# Patient Record
Sex: Male | Born: 1935 | Race: White | Hispanic: No | Smoking: Former smoker
Health system: Southern US, Community
[De-identification: ages and names within clinical notes are randomized; demographics above are authoritative.]

## PROBLEM LIST (undated history)

## (undated) DIAGNOSIS — C61 Malignant neoplasm of prostate: Secondary | ICD-10-CM

## (undated) DIAGNOSIS — I739 Peripheral vascular disease, unspecified: Secondary | ICD-10-CM

## (undated) DIAGNOSIS — J189 Pneumonia, unspecified organism: Secondary | ICD-10-CM

## (undated) DIAGNOSIS — I779 Disorder of arteries and arterioles, unspecified: Secondary | ICD-10-CM

## (undated) DIAGNOSIS — M353 Polymyalgia rheumatica: Secondary | ICD-10-CM

## (undated) DIAGNOSIS — I10 Essential (primary) hypertension: Secondary | ICD-10-CM

## (undated) HISTORY — PX: COLONOSCOPY: SHX174

## (undated) HISTORY — PX: SHOULDER SURGERY: SHX246

## (undated) HISTORY — PX: PROSTATE SURGERY: SHX751

## (undated) HISTORY — PX: TONSILLECTOMY: SUR1361

## (undated) HISTORY — PX: OTHER SURGICAL HISTORY: SHX169

---

## 1974-06-30 HISTORY — PX: VASECTOMY: SHX75

## 2001-02-02 ENCOUNTER — Ambulatory Visit (HOSPITAL_COMMUNITY): Admission: RE | Admit: 2001-02-02 | Discharge: 2001-02-02 | Payer: Self-pay | Admitting: Orthopaedic Surgery

## 2002-11-11 ENCOUNTER — Encounter (INDEPENDENT_AMBULATORY_CARE_PROVIDER_SITE_OTHER): Payer: Self-pay | Admitting: Specialist

## 2002-11-11 ENCOUNTER — Ambulatory Visit (HOSPITAL_COMMUNITY): Admission: RE | Admit: 2002-11-11 | Discharge: 2002-11-11 | Payer: Self-pay | Admitting: Gastroenterology

## 2003-04-06 ENCOUNTER — Encounter: Payer: Self-pay | Admitting: Internal Medicine

## 2003-04-06 ENCOUNTER — Encounter: Admission: RE | Admit: 2003-04-06 | Discharge: 2003-04-06 | Payer: Self-pay | Admitting: Internal Medicine

## 2006-12-01 ENCOUNTER — Ambulatory Visit: Admission: RE | Admit: 2006-12-01 | Discharge: 2007-03-10 | Payer: Self-pay | Admitting: Radiation Oncology

## 2006-12-09 ENCOUNTER — Encounter: Admission: RE | Admit: 2006-12-09 | Discharge: 2006-12-09 | Payer: Self-pay | Admitting: Urology

## 2007-01-25 ENCOUNTER — Ambulatory Visit (HOSPITAL_BASED_OUTPATIENT_CLINIC_OR_DEPARTMENT_OTHER): Admission: RE | Admit: 2007-01-25 | Discharge: 2007-01-25 | Payer: Self-pay | Admitting: Urology

## 2007-07-14 ENCOUNTER — Ambulatory Visit: Payer: Self-pay | Admitting: Vascular Surgery

## 2007-07-14 ENCOUNTER — Encounter (INDEPENDENT_AMBULATORY_CARE_PROVIDER_SITE_OTHER): Payer: Self-pay | Admitting: Internal Medicine

## 2007-07-14 ENCOUNTER — Ambulatory Visit (HOSPITAL_COMMUNITY): Admission: RE | Admit: 2007-07-14 | Discharge: 2007-07-14 | Payer: Self-pay | Admitting: Internal Medicine

## 2009-06-09 ENCOUNTER — Encounter: Admission: RE | Admit: 2009-06-09 | Discharge: 2009-06-09 | Payer: Self-pay | Admitting: Orthopaedic Surgery

## 2009-10-10 ENCOUNTER — Ambulatory Visit: Payer: Self-pay | Admitting: Surgery

## 2009-10-10 ENCOUNTER — Ambulatory Visit (HOSPITAL_COMMUNITY): Admission: RE | Admit: 2009-10-10 | Discharge: 2009-10-10 | Payer: Self-pay | Admitting: Internal Medicine

## 2009-10-10 ENCOUNTER — Encounter (INDEPENDENT_AMBULATORY_CARE_PROVIDER_SITE_OTHER): Payer: Self-pay | Admitting: Internal Medicine

## 2010-11-12 NOTE — Op Note (Signed)
Jerry Booth, Jerry Booth              ACCOUNT NO.:  [id number]   MEDICAL RECORD NO.:  [id number]          PATIENT TYPE:  AMB   LOCATION:  [facility]                         FACILITY:  [facility]   PHYSICIAN:  Maretta Bees. Vonita Moss, M.D.DATE OF BIRTH:  [birth year]   DATE OF PROCEDURE:  01/25/2007  DATE OF DISCHARGE:                               OPERATIVE REPORT   PREOPERATIVE DIAGNOSIS:  Carcinoma of the prostate.   POSTOPERATIVE DIAGNOSIS:  Carcinoma of the prostate.   PROCEDURE:  Radioactive seed implantation to prostate and cystoscopy.   SURGEON:  Dr. Larey Dresser.   ASSISTANT:  Dr. Chipper Herb.   ANESTHESIA:  General.   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  He was  counseled about therapeutic measures and opted for radioactive seed  implantation.  He is brought to the OR for that reason.   PROCEDURE:  The patient was brought to the operating room, placed in  lithotomy position, external genitalia and lower abdomen were prepped  and draped in the usual fashion.  A Foley catheter was inserted and a  rectal catheter was inserted and transrectal ultrasound treatment  planning was performed. He then underwent radioactive seed implantation  by utilizing 30 needles and 76 total seeds composed of I-125, total  activity was 33.5160 mCi. The needle and seed placement was felt to be  excellent.  Postop radiograph was indicative of a good implant. After  removal of the stabilizing needles and Foley catheter,  he was  cystoscoped and the anterior and prostatic urethra and bladder were all  were unremarkable with no evidence of seed or injury. The Foley catheter  was reinserted and he was taken to the recovery room in good condition  having tolerated the procedure well.      Maretta Bees. Vonita Moss, M.D.  Electronically Signed     LJP/MEDQ  D:  01/25/2007  T:  01/25/2007  Job:  [id number]

## 2010-11-15 NOTE — Op Note (Signed)
   NAME:  Jerry Booth, Jerry Booth                        ACCOUNT NO.:  [id number]   MEDICAL RECORD NO.:  [id number]                   PATIENT TYPE:  AMB   LOCATION:  ENDO                                 FACILITY:  [facility]   PHYSICIAN:  John C. Madilyn Fireman, M.D.                 DATE OF BIRTH:  [birth year]   DATE OF PROCEDURE:  11/11/2002  DATE OF DISCHARGE:                                 OPERATIVE REPORT   PROCEDURE:  Colonoscopy with polypectomy.   INDICATIONS FOR PROCEDURE:  Colon cancer screening in a 77 year old patient  with no prior screening.   DESCRIPTION OF PROCEDURE:  The patient was placed in the left lateral  decubitus position then placed on the pulse monitor with continuous low flow  oxygen delivered by nasal cannula. He was sedated with 62.5 mcg IV fentanyl  and 5 mg IV Versed. The Olympus video colonoscope was inserted into the  rectum and advanced to the cecum, confirmed by transillumination at  McBurney's point and visualization of the ileocecal valve and appendiceal  orifice. The prep was excellent. The cecum appeared normal with no masses,  polyps, diverticula or other mucosal abnormalities. There were scattered  diverticula seen in the ascending, transverse, descending and sigmoid colon  with no other abnormalities seen. The rectum appeared normal and retroflexed  view of the anus revealed only small internal hemorrhoids. The scope was  then withdrawn and the patient returned to the recovery room in stable  condition. The patient tolerated the procedure well and there were no  immediate complications.   IMPRESSION:  6 mm rectal polyp, otherwise, normal study.   PLAN:  Await biopsy results to determine method and interval for future  colon screening.                                               John C. Madilyn Fireman, M.D.    JCH/MEDQ  D:  11/11/2002  T:  11/11/2002  Job:  [id number]   cc:   Armstead Peaks, M.D.  [address] [place], [place] [address]  Fax: [phone]

## 2011-01-20 ENCOUNTER — Other Ambulatory Visit: Payer: Self-pay | Admitting: Dermatology

## 2011-04-10 ENCOUNTER — Other Ambulatory Visit: Payer: Self-pay | Admitting: Dermatology

## 2011-04-14 LAB — CBC
HCT: 43.8
Hemoglobin: 15.2
MCHC: 34.7
MCV: 97.1
Platelets: 257
RDW: 13.9

## 2011-04-14 LAB — COMPREHENSIVE METABOLIC PANEL
Albumin: 4.2
Alkaline Phosphatase: 67
BUN: 18
Calcium: 9.2
Creatinine, Ser: 0.92
Glucose, Bld: 94
Total Protein: 6.6

## 2011-04-14 LAB — PROTIME-INR
INR: 0.9
Prothrombin Time: 12.7

## 2011-04-14 LAB — APTT: aPTT: 31

## 2011-04-28 ENCOUNTER — Emergency Department (HOSPITAL_COMMUNITY)
Admission: EM | Admit: 2011-04-28 | Discharge: 2011-04-28 | Disposition: A | Discharge status: Home / Self Care | Payer: Medicare Other | Attending: Emergency Medicine | Admitting: Emergency Medicine

## 2011-04-28 DIAGNOSIS — R339 Retention of urine, unspecified: Secondary | ICD-10-CM | POA: Insufficient documentation

## 2011-04-28 DIAGNOSIS — Z9889 Other specified postprocedural states: Secondary | ICD-10-CM | POA: Insufficient documentation

## 2011-04-28 DIAGNOSIS — I10 Essential (primary) hypertension: Secondary | ICD-10-CM | POA: Insufficient documentation

## 2011-04-28 DIAGNOSIS — Z8546 Personal history of malignant neoplasm of prostate: Secondary | ICD-10-CM | POA: Insufficient documentation

## 2011-04-28 LAB — URINALYSIS, ROUTINE W REFLEX MICROSCOPIC
Bilirubin Urine: NEGATIVE
Glucose, UA: NEGATIVE mg/dL
Ketones, ur: NEGATIVE mg/dL
Leukocytes, UA: NEGATIVE
Nitrite: NEGATIVE
Protein, ur: NEGATIVE mg/dL
Specific Gravity, Urine: 1.006 (ref 1.005–1.030)
Urobilinogen, UA: 0.2 mg/dL (ref 0.0–1.0)
pH: 6.5 (ref 5.0–8.0)

## 2011-04-28 LAB — URINE MICROSCOPIC-ADD ON

## 2011-04-30 LAB — URINE CULTURE
Colony Count: NO GROWTH
Culture  Setup Time: 201210300359
Culture: NO GROWTH

## 2011-11-14 ENCOUNTER — Other Ambulatory Visit: Payer: Self-pay | Admitting: Dermatology

## 2012-10-19 ENCOUNTER — Other Ambulatory Visit: Payer: Self-pay | Admitting: Dermatology

## 2013-02-10 ENCOUNTER — Other Ambulatory Visit: Payer: Self-pay

## 2013-04-12 ENCOUNTER — Other Ambulatory Visit: Payer: Self-pay | Admitting: Dermatology

## 2013-09-01 ENCOUNTER — Other Ambulatory Visit: Payer: Self-pay | Admitting: Dermatology

## 2013-10-07 HISTORY — PX: OTHER SURGICAL HISTORY: SHX169

## 2013-11-16 ENCOUNTER — Other Ambulatory Visit (HOSPITAL_COMMUNITY): Payer: Self-pay | Admitting: Internal Medicine

## 2013-11-16 DIAGNOSIS — I6529 Occlusion and stenosis of unspecified carotid artery: Secondary | ICD-10-CM

## 2013-11-23 ENCOUNTER — Ambulatory Visit (HOSPITAL_COMMUNITY)
Admission: RE | Admit: 2013-11-23 | Discharge: 2013-11-23 | Disposition: A | Discharge status: Home / Self Care | Payer: Medicare HMO | Source: Ambulatory Visit | Attending: Internal Medicine | Admitting: Internal Medicine

## 2013-11-23 DIAGNOSIS — I6529 Occlusion and stenosis of unspecified carotid artery: Secondary | ICD-10-CM | POA: Insufficient documentation

## 2013-11-23 NOTE — Progress Notes (Signed)
Bilateral carotid artery duplex completed.  Right:  1-39% ICA stenosis.  Left:  40-59% internal carotid artery stenosis.  Bilateral:  Vertebral artery flow is antegrade.

## 2013-12-08 ENCOUNTER — Other Ambulatory Visit: Payer: Self-pay | Admitting: Dermatology

## 2014-01-10 ENCOUNTER — Other Ambulatory Visit: Payer: Self-pay | Admitting: Dermatology

## 2014-03-13 ENCOUNTER — Other Ambulatory Visit: Payer: Self-pay | Admitting: Dermatology

## 2014-03-20 ENCOUNTER — Other Ambulatory Visit: Payer: Self-pay | Admitting: Dermatology

## 2014-10-19 ENCOUNTER — Other Ambulatory Visit: Payer: Self-pay | Admitting: Dermatology

## 2015-04-03 DIAGNOSIS — Z23 Encounter for immunization: Secondary | ICD-10-CM | POA: Diagnosis not present

## 2015-06-07 DIAGNOSIS — I10 Essential (primary) hypertension: Secondary | ICD-10-CM | POA: Diagnosis not present

## 2015-07-23 DIAGNOSIS — Z85828 Personal history of other malignant neoplasm of skin: Secondary | ICD-10-CM | POA: Diagnosis not present

## 2015-07-23 DIAGNOSIS — C44321 Squamous cell carcinoma of skin of nose: Secondary | ICD-10-CM | POA: Diagnosis not present

## 2015-07-23 DIAGNOSIS — L570 Actinic keratosis: Secondary | ICD-10-CM | POA: Diagnosis not present

## 2015-07-23 DIAGNOSIS — D485 Neoplasm of uncertain behavior of skin: Secondary | ICD-10-CM | POA: Diagnosis not present

## 2015-07-30 DIAGNOSIS — C44321 Squamous cell carcinoma of skin of nose: Secondary | ICD-10-CM | POA: Diagnosis not present

## 2015-07-30 DIAGNOSIS — L570 Actinic keratosis: Secondary | ICD-10-CM | POA: Diagnosis not present

## 2015-07-30 DIAGNOSIS — D485 Neoplasm of uncertain behavior of skin: Secondary | ICD-10-CM | POA: Diagnosis not present

## 2015-07-30 DIAGNOSIS — Z85828 Personal history of other malignant neoplasm of skin: Secondary | ICD-10-CM | POA: Diagnosis not present

## 2015-08-09 DIAGNOSIS — C44321 Squamous cell carcinoma of skin of nose: Secondary | ICD-10-CM | POA: Diagnosis not present

## 2015-08-09 DIAGNOSIS — Z85828 Personal history of other malignant neoplasm of skin: Secondary | ICD-10-CM | POA: Diagnosis not present

## 2015-08-22 DIAGNOSIS — R69 Illness, unspecified: Secondary | ICD-10-CM | POA: Diagnosis not present

## 2015-09-20 DIAGNOSIS — D485 Neoplasm of uncertain behavior of skin: Secondary | ICD-10-CM | POA: Diagnosis not present

## 2015-09-20 DIAGNOSIS — L821 Other seborrheic keratosis: Secondary | ICD-10-CM | POA: Diagnosis not present

## 2015-09-20 DIAGNOSIS — D1801 Hemangioma of skin and subcutaneous tissue: Secondary | ICD-10-CM | POA: Diagnosis not present

## 2015-09-20 DIAGNOSIS — L570 Actinic keratosis: Secondary | ICD-10-CM | POA: Diagnosis not present

## 2015-09-20 DIAGNOSIS — Z85828 Personal history of other malignant neoplasm of skin: Secondary | ICD-10-CM | POA: Diagnosis not present

## 2015-09-20 DIAGNOSIS — D045 Carcinoma in situ of skin of trunk: Secondary | ICD-10-CM | POA: Diagnosis not present

## 2015-09-20 DIAGNOSIS — L853 Xerosis cutis: Secondary | ICD-10-CM | POA: Diagnosis not present

## 2015-09-24 DIAGNOSIS — H5213 Myopia, bilateral: Secondary | ICD-10-CM | POA: Diagnosis not present

## 2015-11-22 DIAGNOSIS — J069 Acute upper respiratory infection, unspecified: Secondary | ICD-10-CM | POA: Diagnosis not present

## 2015-12-06 DIAGNOSIS — Z1389 Encounter for screening for other disorder: Secondary | ICD-10-CM | POA: Diagnosis not present

## 2015-12-06 DIAGNOSIS — E7800 Pure hypercholesterolemia, unspecified: Secondary | ICD-10-CM | POA: Diagnosis not present

## 2015-12-06 DIAGNOSIS — R7301 Impaired fasting glucose: Secondary | ICD-10-CM | POA: Diagnosis not present

## 2015-12-06 DIAGNOSIS — I10 Essential (primary) hypertension: Secondary | ICD-10-CM | POA: Diagnosis not present

## 2015-12-06 DIAGNOSIS — Z0000 Encounter for general adult medical examination without abnormal findings: Secondary | ICD-10-CM | POA: Diagnosis not present

## 2016-01-04 DIAGNOSIS — Z85828 Personal history of other malignant neoplasm of skin: Secondary | ICD-10-CM | POA: Diagnosis not present

## 2016-01-04 DIAGNOSIS — L570 Actinic keratosis: Secondary | ICD-10-CM | POA: Diagnosis not present

## 2016-01-04 DIAGNOSIS — L821 Other seborrheic keratosis: Secondary | ICD-10-CM | POA: Diagnosis not present

## 2016-01-04 DIAGNOSIS — L812 Freckles: Secondary | ICD-10-CM | POA: Diagnosis not present

## 2016-01-04 DIAGNOSIS — D692 Other nonthrombocytopenic purpura: Secondary | ICD-10-CM | POA: Diagnosis not present

## 2016-01-28 DIAGNOSIS — E7800 Pure hypercholesterolemia, unspecified: Secondary | ICD-10-CM | POA: Diagnosis not present

## 2016-01-28 DIAGNOSIS — Z0000 Encounter for general adult medical examination without abnormal findings: Secondary | ICD-10-CM | POA: Diagnosis not present

## 2016-01-28 DIAGNOSIS — I10 Essential (primary) hypertension: Secondary | ICD-10-CM | POA: Diagnosis not present

## 2016-03-31 DIAGNOSIS — R69 Illness, unspecified: Secondary | ICD-10-CM | POA: Diagnosis not present

## 2016-04-09 DIAGNOSIS — Z8546 Personal history of malignant neoplasm of prostate: Secondary | ICD-10-CM | POA: Diagnosis not present

## 2016-04-09 DIAGNOSIS — K4090 Unilateral inguinal hernia, without obstruction or gangrene, not specified as recurrent: Secondary | ICD-10-CM | POA: Diagnosis not present

## 2016-05-06 DIAGNOSIS — L820 Inflamed seborrheic keratosis: Secondary | ICD-10-CM | POA: Diagnosis not present

## 2016-05-06 DIAGNOSIS — D485 Neoplasm of uncertain behavior of skin: Secondary | ICD-10-CM | POA: Diagnosis not present

## 2016-05-06 DIAGNOSIS — C44519 Basal cell carcinoma of skin of other part of trunk: Secondary | ICD-10-CM | POA: Diagnosis not present

## 2016-05-06 DIAGNOSIS — Z85828 Personal history of other malignant neoplasm of skin: Secondary | ICD-10-CM | POA: Diagnosis not present

## 2016-05-06 DIAGNOSIS — L821 Other seborrheic keratosis: Secondary | ICD-10-CM | POA: Diagnosis not present

## 2016-05-06 DIAGNOSIS — L812 Freckles: Secondary | ICD-10-CM | POA: Diagnosis not present

## 2016-05-06 DIAGNOSIS — L570 Actinic keratosis: Secondary | ICD-10-CM | POA: Diagnosis not present

## 2016-06-06 DIAGNOSIS — I10 Essential (primary) hypertension: Secondary | ICD-10-CM | POA: Diagnosis not present

## 2016-06-06 DIAGNOSIS — R7301 Impaired fasting glucose: Secondary | ICD-10-CM | POA: Diagnosis not present

## 2016-06-06 DIAGNOSIS — K4090 Unilateral inguinal hernia, without obstruction or gangrene, not specified as recurrent: Secondary | ICD-10-CM | POA: Diagnosis not present

## 2016-06-27 ENCOUNTER — Ambulatory Visit: Payer: Self-pay | Admitting: General Surgery

## 2016-06-27 DIAGNOSIS — K4090 Unilateral inguinal hernia, without obstruction or gangrene, not specified as recurrent: Secondary | ICD-10-CM | POA: Diagnosis not present

## 2016-06-27 NOTE — H&P (Signed)
HANIF YANNUZZI 06/27/2016 10:49 AM Location: [facility] Patient #: [id number] DOB: 1931/05/17 Married / Language: [name] / Race: White Male   History of Present Illness [name] MD; 06/27/2016 11:30 AM) The patient is a 82 year old male.  Note:He is referred by Dr. Lavone Orn because of a symptomatic right inguinal hernia. The hernias been long-standing but recently it become uncomfortable at times and larger. He denies any difficulty with urination or constipation. He is active and exercises at the [facility]. He also plays golf. He is unaware of any family history of inguinal hernia.  His past medical history is notable for some hypertension and also a 50% left internal carotid artery stenosis that is being monitored.  Past Surgical History Nance Pear, [place]; 06/27/2016 10:49 AM) Cataract Surgery  Bilateral. Shoulder Surgery  Bilateral. Tonsillectomy  TURP  Vasectomy   Diagnostic Studies History Nance Pear, [place]; 06/27/2016 10:49 AM) Colonoscopy  5-10 years ago  Allergies Nance Pear, CMA; 06/27/2016 10:49 AM) No Known Drug Allergies 06/27/2016  Medication History Nance Pear, CMA; 06/27/2016 10:51 AM) Felodipine (5MG  Tablet ER, Oral daily) Active. Atorvastatin Calcium (40MG  Tablet, Oral daily) Active. Aspirin (81MG  Tablet, Oral daily) Active. Medications Reconciled  Social History Nance Pear, [place]; 06/27/2016 10:49 AM) Alcohol use  Moderate alcohol use. Caffeine use  Carbonated beverages, Coffee, Tea. No drug use  Tobacco use  Former smoker.  Family History Nance Pear, [place]; 06/27/2016 10:49 AM) Cancer  Brother. Cerebrovascular Accident  Father. Hypertension  Sister.  Other Problems Nance Pear, CMA; 06/27/2016 10:49 AM) High blood pressure  Inguinal Hernia  Prostate Cancer     Review of Systems Nance Pear CMA; 06/27/2016 10:49 AM) General Not Present- Appetite Loss, Chills, Fatigue, Fever,  Night Sweats, Weight Gain and Weight Loss. Skin Not Present- Change in Wart/Mole, Dryness, Hives, Jaundice, New Lesions, Non-Healing Wounds, Rash and Ulcer. HEENT Not Present- Earache, Hearing Loss, Hoarseness, Nose Bleed, Oral Ulcers, Ringing in the Ears, Seasonal Allergies, Sinus Pain, Sore Throat, Visual Disturbances, Wears glasses/contact lenses and Yellow Eyes. Respiratory Not Present- Bloody sputum, Chronic Cough, Difficulty Breathing, Snoring and Wheezing. Breast Not Present- Breast Mass, Breast Pain, Nipple Discharge and Skin Changes. Cardiovascular Not Present- Chest Pain, Difficulty Breathing Lying Down, Leg Cramps, Palpitations, Rapid Heart Rate, Shortness of Breath and Swelling of Extremities. Gastrointestinal Not Present- Abdominal Pain, Bloating, Bloody Stool, Change in Bowel Habits, Chronic diarrhea, Constipation, Difficulty Swallowing, Excessive gas, Gets full quickly at meals, Hemorrhoids, Indigestion, Nausea, Rectal Pain and Vomiting. Male Genitourinary Not Present- Blood in Urine, Change in Urinary Stream, Frequency, Impotence, Nocturia, Painful Urination, Urgency and Urine Leakage. Musculoskeletal Not Present- Back Pain, Joint Pain, Joint Stiffness, Muscle Pain, Muscle Weakness and Swelling of Extremities. Neurological Not Present- Decreased Memory, Fainting, Headaches, Numbness, Seizures, Tingling, Tremor, Trouble walking and Weakness. Psychiatric Not Present- Anxiety, Bipolar, Change in Sleep Pattern, Depression, Fearful and Frequent crying. Endocrine Not Present- Cold Intolerance, Excessive Hunger, Hair Changes, Heat Intolerance, Hot flashes and New Diabetes. Hematology Not Present- Blood Thinners, Easy Bruising, Excessive bleeding, Gland problems, HIV and Persistent Infections.  Vitals Bary Castilla Bradford CMA; 06/27/2016 10:51 AM) 06/27/2016 10:51 AM Weight: 169.6 lb Height: 70in Body Surface Area: 1.95 m Body Mass Index: 24.33 kg/m  Temp.: 98.42F  Pulse: 61 (Regular)   BP: 130/78 (Sitting, Left Arm, Standard)    Physical Exam [name] MD; 06/27/2016 11:35 AM) The physical exam findings are as follows: Note:General: WDWN in NAD. Pleasant and cooperative.  HEENT: [place]/AT, no external nasal or ear masses,  mucous membranes are moist  EYES: EOMI, slight redness in sclera of left eye  CV: RRR, no murmur, no edema  CHEST/RESP: Breath sounds equal and clear. Respirations nonlabored.  ABDOMEN: Soft, nontender, nondistended, no umbilical hernia  GU: Reducible right inguinal bulge, no left inguinal bulge. No testicular masses  SKIN: No jaundice.  NEUROLOGIC: Alert and oriented, answers questions appropriately, normal gait and station.  PSYCHIATRIC: Normal mood, affect , and behavior.    Assessment & Plan [name] MD; 06/27/2016 11:33 AM) INGUINAL HERNIA OF RIGHT SIDE WITHOUT OBSTRUCTION OR GANGRENE (K40.90) Impression: This is become symptomatic recently and larger. I recommended open repair with mesh and he is agreeable with this.  Plan: Open right inguinal hernia with mesh as an outpatient. I've asked him to stop his aspirin 5 days prior to surgery. I have explained the procedure, risks, and aftercare of inguinal hernia repair. Risks include but are not limited to bleeding, infection, wound problems, anesthesia, recurrence, bladder or intestine injury, urinary retention, testicular dysfunction, chronic pain, mesh problems. He seems to understand.  Jackolyn Confer, M.D.

## 2016-07-10 NOTE — Pre-Procedure Instructions (Signed)
Jerry Booth  07/10/2016      [id number] [place], [place] [id number] [place] [id number] Phone: [phone] Fax: [phone]    Your procedure is scheduled on January 16  Report to [facility] at [place].M.  Call this number if you have problems the morning of surgery:  [phone]   Remember:  Do not eat food or drink liquids after midnight.   Take these medicines the morning of surgery with A SIP OF WATER Tylenol if needed,   7 days prior to surgery STOP taking any Aspirin, Aleve, Naproxen, Ibuprofen, Motrin, Advil, Goody's, BC's, all herbal medications, fish oil, and all vitamins    Do not wear jewelry.  Do not wear lotions, powders, or cologne, or deoderant.  Men may shave face and neck.  Do not bring valuables to the hospital.  [facility] is not responsible for any belongings or valuables.  Contacts, dentures or bridgework may not be worn into surgery.  Leave your suitcase in the car.  After surgery it may be brought to your room.  For patients admitted to the hospital, discharge time will be determined by your treatment team.  Patients discharged the day of surgery will not be allowed to drive home.    Special instructions:   [facility]- Preparing For Surgery  Before surgery, you can play an important role. Because skin is not sterile, your skin needs to be as free of germs as possible. You can reduce the number of germs on your skin by washing with CHG (chlorahexidine gluconate) Soap before surgery.  CHG is an antiseptic cleaner which kills germs and bonds with the skin to continue killing germs even after washing.  Please do not use if you have an allergy to CHG or antibacterial soaps. If your skin becomes reddened/irritated stop using the CHG.  Do not shave (including legs and underarms) for at least 48 hours prior to first CHG shower. It is OK to shave  your face.  Please follow these instructions carefully.   1. Shower the NIGHT BEFORE SURGERY and the MORNING OF SURGERY with CHG.   2. If you chose to wash your hair, wash your hair first as usual with your normal shampoo.  3. After you shampoo, rinse your hair and body thoroughly to remove the shampoo.  4. Use CHG as you would any other liquid soap. You can apply CHG directly to the skin and wash gently with a scrungie or a clean washcloth.   5. Apply the CHG Soap to your body ONLY FROM THE NECK DOWN.  Do not use on open wounds or open sores. Avoid contact with your eyes, ears, mouth and genitals (private parts). Wash genitals (private parts) with your normal soap.  6. Wash thoroughly, paying special attention to the area where your surgery will be performed.  7. Thoroughly rinse your body with warm water from the neck down.  8. DO NOT shower/wash with your normal soap after using and rinsing off the CHG Soap.  9. Pat yourself dry with a CLEAN TOWEL.   10. Wear CLEAN PAJAMAS   11. Place CLEAN SHEETS on your bed the night of your first shower and DO NOT SLEEP WITH PETS.    Day of Surgery: Do not apply any deodorants/lotions. Please wear clean clothes to the hospital/surgery center.      Please read over the following fact sheets  that you were given.

## 2016-07-11 ENCOUNTER — Encounter (HOSPITAL_COMMUNITY)
Admission: RE | Admit: 2016-07-11 | Discharge: 2016-07-11 | Disposition: A | Discharge status: Home / Self Care | Payer: Medicare HMO | Source: Ambulatory Visit | Attending: General Surgery | Admitting: General Surgery

## 2016-07-11 ENCOUNTER — Encounter (HOSPITAL_COMMUNITY): Payer: Self-pay

## 2016-07-11 DIAGNOSIS — I10 Essential (primary) hypertension: Secondary | ICD-10-CM | POA: Diagnosis not present

## 2016-07-11 DIAGNOSIS — E119 Type 2 diabetes mellitus without complications: Secondary | ICD-10-CM | POA: Diagnosis not present

## 2016-07-11 HISTORY — DX: Disorder of arteries and arterioles, unspecified: I77.9

## 2016-07-11 HISTORY — DX: Essential (primary) hypertension: I10

## 2016-07-11 HISTORY — DX: Peripheral vascular disease, unspecified: I73.9

## 2016-07-11 HISTORY — DX: Malignant neoplasm of prostate: C61

## 2016-07-11 LAB — COMPREHENSIVE METABOLIC PANEL
ALBUMIN: 3.8 g/dL (ref 3.5–5.0)
ALK PHOS: 56 U/L (ref 38–126)
ALT: 18 U/L (ref 17–63)
ANION GAP: 9 (ref 5–15)
AST: 21 U/L (ref 15–41)
BILIRUBIN TOTAL: 0.8 mg/dL (ref 0.3–1.2)
BUN: 18 mg/dL (ref 6–20)
CALCIUM: 9 mg/dL (ref 8.9–10.3)
CO2: 26 mmol/L (ref 22–32)
CREATININE: 1.13 mg/dL (ref 0.61–1.24)
Chloride: 105 mmol/L (ref 101–111)
GFR calc Af Amer: >60 mL/min (ref >60)
GFR calc non Af Amer: 58 mL/min — ABNORMAL LOW (ref >60)
GLUCOSE: 164 mg/dL — AB (ref 65–99)
Potassium: 4.1 mmol/L (ref 3.5–5.1)
Sodium: 140 mmol/L (ref 135–145)
TOTAL PROTEIN: 6.2 g/dL — AB (ref 6.5–8.1)

## 2016-07-11 LAB — CBC
HCT: 42.4 % (ref 39.0–52.0)
HEMOGLOBIN: 14.2 g/dL (ref 13.0–17.0)
MCH: 33.3 pg (ref 26.0–34.0)
MCHC: 33.5 g/dL (ref 30.0–36.0)
MCV: 99.5 fL (ref 78.0–100.0)
Platelets: 207 10*3/uL (ref 150–400)
RBC: 4.26 MIL/uL (ref 4.22–5.81)
RDW: 13.8 % (ref 11.5–15.5)
WBC: 4.6 10*3/uL (ref 4.0–10.5)

## 2016-07-11 NOTE — Progress Notes (Addendum)
PCP is Dr. Lavone Orn States his former PCP was a cardiologist, but he saw him for medical needs only. States he had a stress test 40 years ago. Denies chest pain. Denies ever having a stress test or card cath. EKG reading noted in care everywhere from 09-29-2013. States that the Doctor is watching his carotids, but right now nothing needs to be done. [place] noted from 11-23-2013

## 2016-07-14 ENCOUNTER — Encounter (HOSPITAL_COMMUNITY): Payer: Self-pay

## 2016-07-14 NOTE — Anesthesia Preprocedure Evaluation (Addendum)
Anesthesia Evaluation  Patient identified by MRN, date of birth, ID band Patient awake    Reviewed: Allergy & Precautions, NPO status , Patient's Chart, lab work & pertinent test results  Airway Mallampati: II  TM Distance: >3 FB Neck ROM: Full    Dental  (+) Dental Advisory Given   Pulmonary former smoker,    breath sounds clear to auscultation       Cardiovascular hypertension, Pt. on medications + Peripheral Vascular Disease   Rhythm:Regular Rate:Normal     Neuro/Psych negative neurological ROS     GI/Hepatic negative GI ROS, Neg liver ROS,   Endo/Other  negative endocrine ROS  Renal/GU negative Renal ROS     Musculoskeletal   Abdominal   Peds  Hematology negative hematology ROS (+)   Anesthesia Other Findings   Reproductive/Obstetrics                            Lab Results  Component Value Date   WBC 4.6 07/11/2016   HGB 14.2 07/11/2016   HCT 42.4 07/11/2016   MCV 99.5 07/11/2016   PLT 207 07/11/2016   Lab Results  Component Value Date   CREATININE 1.13 07/11/2016   BUN 18 07/11/2016   NA 140 07/11/2016   K 4.1 07/11/2016   CL 105 07/11/2016   CO2 26 07/11/2016    Anesthesia Physical Anesthesia Plan  ASA: II  Anesthesia Plan: General   Post-op Pain Management:  Regional for Post-op pain   Induction: Intravenous  Airway Management Planned: LMA  Additional Equipment:   Intra-op Plan:   Post-operative Plan: Extubation in OR  Informed Consent: I have reviewed the patients History and Physical, chart, labs and discussed the procedure including the risks, benefits and alternatives for the proposed anesthesia with the patient or authorized representative who has indicated his/her understanding and acceptance.   Dental advisory given  Plan Discussed with: CRNA  Anesthesia Plan Comments:        Anesthesia Quick Evaluation

## 2016-07-14 NOTE — Progress Notes (Signed)
Anesthesia Chart Review: Patient is a 83 year old male scheduled for right inguinal hernia repair with mesh on 07/15/16 by Dr. Zella Richer.  History includes HTN, carotid occlusive disease (no history of CVA), tonsillectomy, prostate cancer s/p radioactive seed '08, former smoker (quit '54).   PCP is Dr. Lavone Orn. Patient reports his doctor is following his carotid artery stenosis, but that disease is not severe enough to require intervention. (Currently, last carotid U/S I have access to is from 2015. Most recent records requested from [facility], but they are pending.)   Meds include ASA 81 mg (on hold for 5 days preoperatively), Lipitor, felodipine.  BP (!) 146/64   Pulse 76   Temp 36.6 C   Resp 20   Ht 5\' 10"  (1.778 m)   Wt 170 lb (77.1 kg)   SpO2 97%   BMI 24.39 kg/m   EKG 07/11/16: SR with occasional PVCs, Incomplete right BBB pattern in V1. PVCs are new when compared to 12/09/06 tracing.  He reports a stress test 40 years ago.   Carotid U/S 11/23/13: Summary: Right: 1-39% ICA stenosis. ICA/CCA ratio is 0.68. Left: 40-59% ICA stenosis. ICA/CCA ratio is 2.0. Bilateral: Vertebral artery flow is antegrade.  Preoperative labs noted. Non-fasting CBG 164. CBC WNL. Cr 1.13.  Above reviewed with anesthesiologist Dr. Marcie Bal. If no acute changes and he remains asymptomatic from a CV/neurologic standpoint then it is anticipated that he can proceed as planned.  George Hugh [facility] Stay [facility]/Anesthesiology Phone [phone] 07/14/2016 1:36 PM

## 2016-07-15 ENCOUNTER — Ambulatory Visit (HOSPITAL_COMMUNITY): Payer: Medicare HMO | Admitting: Vascular Surgery

## 2016-07-15 ENCOUNTER — Ambulatory Visit (HOSPITAL_COMMUNITY): Payer: Medicare HMO | Admitting: Anesthesiology

## 2016-07-15 ENCOUNTER — Ambulatory Visit (HOSPITAL_COMMUNITY)
Admission: RE | Admit: 2016-07-15 | Discharge: 2016-07-15 | Disposition: A | Discharge status: Home / Self Care | Payer: Medicare HMO | Source: Ambulatory Visit | Attending: General Surgery | Admitting: General Surgery

## 2016-07-15 ENCOUNTER — Encounter (HOSPITAL_COMMUNITY)
Admission: RE | Disposition: A | Discharge status: Home / Self Care | Payer: Self-pay | Source: Ambulatory Visit | Attending: General Surgery

## 2016-07-15 ENCOUNTER — Encounter (HOSPITAL_COMMUNITY): Payer: Self-pay | Admitting: Urology

## 2016-07-15 DIAGNOSIS — I10 Essential (primary) hypertension: Secondary | ICD-10-CM | POA: Diagnosis not present

## 2016-07-15 DIAGNOSIS — I739 Peripheral vascular disease, unspecified: Secondary | ICD-10-CM | POA: Insufficient documentation

## 2016-07-15 DIAGNOSIS — G8918 Other acute postprocedural pain: Secondary | ICD-10-CM | POA: Diagnosis not present

## 2016-07-15 DIAGNOSIS — Z87891 Personal history of nicotine dependence: Secondary | ICD-10-CM | POA: Diagnosis not present

## 2016-07-15 DIAGNOSIS — Z7982 Long term (current) use of aspirin: Secondary | ICD-10-CM | POA: Insufficient documentation

## 2016-07-15 DIAGNOSIS — Z79899 Other long term (current) drug therapy: Secondary | ICD-10-CM | POA: Diagnosis not present

## 2016-07-15 DIAGNOSIS — K4090 Unilateral inguinal hernia, without obstruction or gangrene, not specified as recurrent: Secondary | ICD-10-CM | POA: Diagnosis not present

## 2016-07-15 HISTORY — PX: INGUINAL HERNIA REPAIR: SHX194

## 2016-07-15 HISTORY — PX: INSERTION OF MESH: SHX5868

## 2016-07-15 SURGERY — REPAIR, HERNIA, INGUINAL, ADULT
Anesthesia: General | Site: Groin | Laterality: Right

## 2016-07-15 MED ORDER — PROPOFOL 10 MG/ML IV BOLUS
INTRAVENOUS | Status: DC | PRN
  Administered 2016-07-15: 160 mg via INTRAVENOUS
  Administered 2016-07-15: 40 mg via INTRAVENOUS

## 2016-07-15 MED ORDER — CHLORHEXIDINE GLUCONATE CLOTH 2 % EX PADS: 6.0000 | MEDICATED_PAD | Freq: Once | CUTANEOUS | Status: DC

## 2016-07-15 MED ORDER — ONDANSETRON HCL 4 MG/2ML IJ SOLN
INTRAMUSCULAR | Status: DC | PRN
  Administered 2016-07-15: 4 mg via INTRAVENOUS

## 2016-07-15 MED ORDER — PROMETHAZINE HCL 25 MG/ML IJ SOLN: 6.2500 mg | INTRAMUSCULAR | Status: DC | PRN

## 2016-07-15 MED ORDER — MIDAZOLAM HCL 5 MG/5ML IJ SOLN
INTRAMUSCULAR | Status: DC | PRN
  Administered 2016-07-15: 2 mg via INTRAVENOUS

## 2016-07-15 MED ORDER — 0.9 % SODIUM CHLORIDE (POUR BTL) OPTIME
TOPICAL | Status: DC | PRN
  Administered 2016-07-15: 1000 mL

## 2016-07-15 MED ORDER — DEXAMETHASONE SODIUM PHOSPHATE 10 MG/ML IJ SOLN
INTRAMUSCULAR | Status: DC | PRN
  Administered 2016-07-15: 4 mg via INTRAVENOUS

## 2016-07-15 MED ORDER — HYDROMORPHONE HCL 1 MG/ML IJ SOLN: 0.2500 mg | INTRAMUSCULAR | Status: DC | PRN

## 2016-07-15 MED ORDER — CEFAZOLIN SODIUM-DEXTROSE 2-4 GM/100ML-% IV SOLN
2.0000 g | INTRAVENOUS | Status: AC
  Administered 2016-07-15: 2 g via INTRAVENOUS
  Filled 2016-07-15: qty 100

## 2016-07-15 MED ORDER — HYDROCODONE-ACETAMINOPHEN 5-325 MG PO TABS: 1.0000 | ORAL_TABLET | ORAL | 0 refills | Status: DC | PRN

## 2016-07-15 MED ORDER — MIDAZOLAM HCL 2 MG/2ML IJ SOLN
INTRAMUSCULAR | Status: AC
  Filled 2016-07-15: qty 2

## 2016-07-15 MED ORDER — BUPIVACAINE-EPINEPHRINE (PF) 0.5% -1:200000 IJ SOLN
INTRAMUSCULAR | Status: DC | PRN
  Administered 2016-07-15: 30 mL

## 2016-07-15 MED ORDER — FENTANYL CITRATE (PF) 100 MCG/2ML IJ SOLN
INTRAMUSCULAR | Status: DC | PRN
  Administered 2016-07-15 (×2): 50 ug via INTRAVENOUS

## 2016-07-15 MED ORDER — LACTATED RINGERS IV SOLN
INTRAVENOUS | Status: DC | PRN
  Administered 2016-07-15: 07:00:00 via INTRAVENOUS

## 2016-07-15 MED ORDER — BUPIVACAINE-EPINEPHRINE 0.5% -1:200000 IJ SOLN
INTRAMUSCULAR | Status: DC | PRN
  Administered 2016-07-15: 25 mL

## 2016-07-15 MED ORDER — FENTANYL CITRATE (PF) 100 MCG/2ML IJ SOLN
INTRAMUSCULAR | Status: AC
  Filled 2016-07-15: qty 2

## 2016-07-15 MED ORDER — DEXAMETHASONE SODIUM PHOSPHATE 10 MG/ML IJ SOLN
INTRAMUSCULAR | Status: AC
  Filled 2016-07-15: qty 1

## 2016-07-15 MED ORDER — LIDOCAINE 2% (20 MG/ML) 5 ML SYRINGE
INTRAMUSCULAR | Status: DC | PRN
  Administered 2016-07-15: 20 mg via INTRAVENOUS

## 2016-07-15 MED ORDER — PROPOFOL 10 MG/ML IV BOLUS
INTRAVENOUS | Status: AC
  Filled 2016-07-15: qty 20

## 2016-07-15 MED ORDER — ONDANSETRON HCL 4 MG PO TABS: 4.0000 mg | ORAL_TABLET | ORAL | 0 refills | Status: DC | PRN

## 2016-07-15 MED ORDER — BUPIVACAINE-EPINEPHRINE (PF) 0.5% -1:200000 IJ SOLN
INTRAMUSCULAR | Status: AC
  Filled 2016-07-15: qty 30

## 2016-07-15 MED ORDER — ONDANSETRON HCL 4 MG/2ML IJ SOLN
INTRAMUSCULAR | Status: AC
  Filled 2016-07-15: qty 2

## 2016-07-15 SURGICAL SUPPLY — 56 items
APL SKNCLS STERI-STRIP NONHPOA (GAUZE/BANDAGES/DRESSINGS) ×2
BENZOIN TINCTURE PRP APPL 2/3 (GAUZE/BANDAGES/DRESSINGS) ×3 IMPLANT
BLADE SURG 10 STRL SS (BLADE) ×3 IMPLANT
BLADE SURG 15 STRL LF DISP TIS (BLADE) ×2 IMPLANT
BLADE SURG 15 STRL SS (BLADE) ×3
BLADE SURG ROTATE 9660 (MISCELLANEOUS) IMPLANT
CHLORAPREP W/TINT 26ML (MISCELLANEOUS) ×3 IMPLANT
COVER SURGICAL LIGHT HANDLE (MISCELLANEOUS) ×3 IMPLANT
DRAIN PENROSE 1/2X12 LTX STRL (WOUND CARE) IMPLANT
DRAPE INCISE IOBAN 66X45 STRL (DRAPES) ×3 IMPLANT
DRAPE LAPAROTOMY TRNSV 102X78 (DRAPE) ×3 IMPLANT
DRAPE UTILITY XL STRL (DRAPES) ×6 IMPLANT
DRSG TEGADERM 4X4.75 (GAUZE/BANDAGES/DRESSINGS) ×3 IMPLANT
DRSG TELFA 3X8 NADH (GAUZE/BANDAGES/DRESSINGS) ×3 IMPLANT
ELECT CAUTERY BLADE 6.4 (BLADE) ×3 IMPLANT
ELECT REM PT RETURN 9FT ADLT (ELECTROSURGICAL) ×3
ELECTRODE REM PT RTRN 9FT ADLT (ELECTROSURGICAL) ×2 IMPLANT
GAUZE SPONGE 4X4 16PLY XRAY LF (GAUZE/BANDAGES/DRESSINGS) ×3 IMPLANT
GLOVE BIO SURGEON STRL SZ7 (GLOVE) ×1 IMPLANT
GLOVE BIOGEL PI IND STRL 7.0 (GLOVE) IMPLANT
GLOVE BIOGEL PI IND STRL 7.5 (GLOVE) IMPLANT
GLOVE BIOGEL PI IND STRL 8 (GLOVE) ×2 IMPLANT
GLOVE BIOGEL PI INDICATOR 7.0 (GLOVE) ×3
GLOVE BIOGEL PI INDICATOR 7.5 (GLOVE) ×2
GLOVE BIOGEL PI INDICATOR 8 (GLOVE) ×1
GLOVE ECLIPSE 7.0 STRL STRAW (GLOVE) ×1 IMPLANT
GLOVE ECLIPSE 8.0 STRL XLNG CF (GLOVE) ×3 IMPLANT
GLOVE SS BIOGEL STRL SZ 7 (GLOVE) IMPLANT
GLOVE SUPERSENSE BIOGEL SZ 7 (GLOVE) ×1
GOWN STRL REUS W/ TWL LRG LVL3 (GOWN DISPOSABLE) ×4 IMPLANT
GOWN STRL REUS W/ TWL LRG LVL4 (GOWN DISPOSABLE) IMPLANT
GOWN STRL REUS W/TWL LRG LVL3 (GOWN DISPOSABLE) ×15
GOWN STRL REUS W/TWL LRG LVL4 (GOWN DISPOSABLE) ×6
KIT BASIN OR (CUSTOM PROCEDURE TRAY) ×3 IMPLANT
KIT ROOM TURNOVER OR (KITS) ×3 IMPLANT
MESH HERNIA 3X6 (Mesh General) ×1 IMPLANT
NDL HYPO 25GX1X1/2 BEV (NEEDLE) ×2 IMPLANT
NEEDLE HYPO 25GX1X1/2 BEV (NEEDLE) ×3 IMPLANT
NS IRRIG 1000ML POUR BTL (IV SOLUTION) ×3 IMPLANT
PACK SURGICAL SETUP 50X90 (CUSTOM PROCEDURE TRAY) ×3 IMPLANT
PAD ARMBOARD 7.5X6 YLW CONV (MISCELLANEOUS) ×3 IMPLANT
PAD DRESSING TELFA 3X8 NADH (GAUZE/BANDAGES/DRESSINGS) ×2 IMPLANT
PENCIL BUTTON HOLSTER BLD 10FT (ELECTRODE) ×3 IMPLANT
SPECIMEN JAR SMALL (MISCELLANEOUS) IMPLANT
SPONGE LAP 18X18 X RAY DECT (DISPOSABLE) ×3 IMPLANT
STRIP CLOSURE SKIN 1/2X4 (GAUZE/BANDAGES/DRESSINGS) ×3 IMPLANT
SUT MON AB 4-0 PC3 18 (SUTURE) ×3 IMPLANT
SUT PROLENE 2 0 CT2 30 (SUTURE) ×6 IMPLANT
SUT SILK 2 0 SH (SUTURE) IMPLANT
SUT VIC AB 2-0 SH 18 (SUTURE) ×3 IMPLANT
SUT VIC AB 3-0 SH 27 (SUTURE) ×6
SUT VIC AB 3-0 SH 27XBRD (SUTURE) ×2 IMPLANT
SUT VICRYL AB 3 0 TIES (SUTURE) ×3 IMPLANT
SYR CONTROL 10ML LL (SYRINGE) ×3 IMPLANT
TOWEL OR 17X24 6PK STRL BLUE (TOWEL DISPOSABLE) ×3 IMPLANT
TOWEL OR 17X26 10 PK STRL BLUE (TOWEL DISPOSABLE) ×3 IMPLANT

## 2016-07-15 NOTE — Anesthesia Procedure Notes (Signed)
Anesthesia Regional Block:  TAP block  Pre-Anesthetic Checklist: ,, timeout performed, Correct Patient, Correct Site, Correct Laterality, Correct Procedure, Correct Position, site marked, Risks and benefits discussed,  Surgical consent,  Pre-op evaluation,  At surgeon's request and post-op pain management  Laterality: Right  Prep: chloraprep       Needles:  Injection technique: Single-shot  Needle Type: Echogenic Needle     Needle Length: 9cm 9 cm Needle Gauge: 21 and 21 G    Additional Needles:  Procedures: ultrasound guided (picture in chart) TAP block Narrative:  Start time: 07/15/2016 7:17 AM End time: 07/15/2016 7:24 AM Injection made incrementally with aspirations every 5 mL.  Performed by: Personally  Anesthesiologist: Suzette Battiest

## 2016-07-15 NOTE — Op Note (Signed)
OPERATIVE NOTE-INGUINAL HERNIA REPAIR  Preoperative diagnosis:  Right inguinal hernia.  Postoperative diagnosis:  Same (pantaloon hernia)  Procedure:  Right inguinal hernia repair with mesh.  Surgeon:  Jackolyn Confer, M.D.  Anesthesia:  General/LMA with TAP block and local (Marcaine).  EBL:  < 100 ml  Indication:  This is a 83 year old active male with a symptomatic right inguinal hernia who presents for elective repair.  Technique:  He was seen in the holding room and the right groin was marked with my initials. He was brought to the operating, placed supine on the operating table, and the anesthetic was administered by the anesthesiologist. The hair in the groin area was clipped as was felt to be necessary. This area was then sterilely prepped and draped.  Local anesthetic was infiltrated in the superficial and deep tissues in the right groin.  An incision was made through the skin and subcutaneous tissue until the external oblique aponeurosis was identified.  Local anesthetic was infiltrated deep to the external oblique aponeurosis. The external oblique aponeurosis was divided through the external ring medially and back toward the anterior superior iliac spine laterally. Using blunt dissection, the shelving edge of the inguinal ligament was identified inferiorly and the internal oblique aponeurosis and muscle were identified superiorly. The ilioinguinal nerve was identified and preserved.  The spermatic cord was isolated and a posterior window was made around it.  Indirect and direct hernia sacs were identified and separated from the spermatic cord using blunt dissection. The hernia sac and its contents were reduced through the hernia defects.   A piece of 3" x 6" polypropylene mesh was brought into the field and anchored 1-2 cm medial to the pubic tubercle with 2-0 Prolene suture. The inferior aspect of the mesh was anchored to the shelving edge of the inguinal ligament with running 2-0  Prolene suture to a level 1-2 cm lateral to the internal ring. A slit was cut in the mesh creating 2 tails. These were wrapped around the spermatic cord. The superior aspect of the mesh was anchored to the internal oblique aponeurosis and muscle with interrupted 2-0 Vicryl sutures. The 2 tails of the mesh were then crossed creating a new internal ring and were anchored to the shelving edge of the inguinal ligament with 2-0 Prolene suture. The tip of a hemostat could be placed through the new aperture. The lateral aspect of the mesh was then tucked deep to the external oblique aponeurosis.  The wound was inspected and hemostasis was adequate. The external oblique aponeurosis was then closed over the mesh and cord with running 3-0 Vicryl suture. The subcutaneous tissue was closed with running 3-0 Vicryl suture. The skin closed with a running 4-0 Monocryl subcuticular stitch.  Steri-Strips and a sterile dressing were applied.  The procedure was well-tolerated without any apparent complications and he was taken to the recovery room in satisfactory condition.

## 2016-07-15 NOTE — H&P (View-Only) (Signed)
Jerry Booth 06/27/2016 10:49 AM Location: [facility] Patient #: [id number] DOB: 18-Nov-1930 Married / Language: Jerry Booth / Race: White Male   History of Present Illness Jerry Hollingshead MD; 06/27/2016 11:30 AM) The patient is a 83 year old male.  Note:He is referred by Dr. Lavone Orn because of a symptomatic right inguinal hernia. The hernias been long-standing but recently it become uncomfortable at times and larger. He denies any difficulty with urination or constipation. He is active and exercises at the [facility]. He also plays golf. He is unaware of any family history of inguinal hernia.  His past medical history is notable for some hypertension and also a 50% left internal carotid artery stenosis that is being monitored.  Past Surgical History Nance Pear, [place]; 06/27/2016 10:49 AM) Cataract Surgery  Bilateral. Shoulder Surgery  Bilateral. Tonsillectomy  TURP  Vasectomy   Diagnostic Studies History Nance Pear, [place]; 06/27/2016 10:49 AM) Colonoscopy  5-10 years ago  Allergies Nance Pear, CMA; 06/27/2016 10:49 AM) No Known Drug Allergies 06/27/2016  Medication History Nance Pear, CMA; 06/27/2016 10:51 AM) Felodipine (5MG  Tablet ER, Oral daily) Active. Atorvastatin Calcium (40MG  Tablet, Oral daily) Active. Aspirin (81MG  Tablet, Oral daily) Active. Medications Reconciled  Social History Nance Pear, [place]; 06/27/2016 10:49 AM) Alcohol use  Moderate alcohol use. Caffeine use  Carbonated beverages, Coffee, Tea. No drug use  Tobacco use  Former smoker.  Family History Nance Pear, [place]; 06/27/2016 10:49 AM) Cancer  Brother. Cerebrovascular Accident  Father. Hypertension  Sister.  Other Problems Nance Pear, CMA; 06/27/2016 10:49 AM) High blood pressure  Inguinal Hernia  Prostate Cancer     Review of Systems Nance Pear CMA; 06/27/2016 10:49 AM) General Not Present- Appetite Loss, Chills, Fatigue, Fever,  Night Sweats, Weight Gain and Weight Loss. Skin Not Present- Change in Wart/Mole, Dryness, Hives, Jaundice, New Lesions, Non-Healing Wounds, Rash and Ulcer. HEENT Not Present- Earache, Hearing Loss, Hoarseness, Nose Bleed, Oral Ulcers, Ringing in the Ears, Seasonal Allergies, Sinus Pain, Sore Throat, Visual Disturbances, Wears glasses/contact lenses and Yellow Eyes. Respiratory Not Present- Bloody sputum, Chronic Cough, Difficulty Breathing, Snoring and Wheezing. Breast Not Present- Breast Mass, Breast Pain, Nipple Discharge and Skin Changes. Cardiovascular Not Present- Chest Pain, Difficulty Breathing Lying Down, Leg Cramps, Palpitations, Rapid Heart Rate, Shortness of Breath and Swelling of Extremities. Gastrointestinal Not Present- Abdominal Pain, Bloating, Bloody Stool, Change in Bowel Habits, Chronic diarrhea, Constipation, Difficulty Swallowing, Excessive gas, Gets full quickly at meals, Hemorrhoids, Indigestion, Nausea, Rectal Pain and Vomiting. Male Genitourinary Not Present- Blood in Urine, Change in Urinary Stream, Frequency, Impotence, Nocturia, Painful Urination, Urgency and Urine Leakage. Musculoskeletal Not Present- Back Pain, Joint Pain, Joint Stiffness, Muscle Pain, Muscle Weakness and Swelling of Extremities. Neurological Not Present- Decreased Memory, Fainting, Headaches, Numbness, Seizures, Tingling, Tremor, Trouble walking and Weakness. Psychiatric Not Present- Anxiety, Bipolar, Change in Sleep Pattern, Depression, Fearful and Frequent crying. Endocrine Not Present- Cold Intolerance, Excessive Hunger, Hair Changes, Heat Intolerance, Hot flashes and New Diabetes. Hematology Not Present- Blood Thinners, Easy Bruising, Excessive bleeding, Gland problems, HIV and Persistent Infections.  Vitals Bary Castilla Bradford CMA; 06/27/2016 10:51 AM) 06/27/2016 10:51 AM Weight: 169.6 lb Height: 70in Body Surface Area: 1.95 m Body Mass Index: 24.33 kg/m  Temp.: 98.82F  Pulse: 61 (Regular)   BP: 130/78 (Sitting, Left Arm, Standard)    Physical Exam Jerry Hollingshead MD; 06/27/2016 11:35 AM) The physical exam findings are as follows: Note:General: WDWN in NAD. Pleasant and cooperative.  HEENT: [place]/AT, no external nasal or ear masses,  mucous membranes are moist  EYES: EOMI, slight redness in sclera of left eye  CV: RRR, no murmur, no edema  CHEST/RESP: Breath sounds equal and clear. Respirations nonlabored.  ABDOMEN: Soft, nontender, nondistended, no umbilical hernia  GU: Reducible right inguinal bulge, no left inguinal bulge. No testicular masses  SKIN: No jaundice.  NEUROLOGIC: Alert and oriented, answers questions appropriately, normal gait and station.  PSYCHIATRIC: Normal mood, affect , and behavior.    Assessment & Plan Jerry Hollingshead MD; 06/27/2016 11:33 AM) INGUINAL HERNIA OF RIGHT SIDE WITHOUT OBSTRUCTION OR GANGRENE (K40.90) Impression: This is become symptomatic recently and larger. I recommended open repair with mesh and he is agreeable with this.  Plan: Open right inguinal hernia with mesh as an outpatient. I've asked him to stop his aspirin 5 days prior to surgery. I have explained the procedure, risks, and aftercare of inguinal hernia repair. Risks include but are not limited to bleeding, infection, wound problems, anesthesia, recurrence, bladder or intestine injury, urinary retention, testicular dysfunction, chronic pain, mesh problems. He seems to understand.  Jackolyn Confer, M.D.

## 2016-07-15 NOTE — Transfer of Care (Signed)
Immediate Anesthesia Transfer of Care Note  Patient: Jerry Booth  Procedure(s) Performed: Procedure(s): RIGHT INGUINAL HERNIA REPAIR (Right) INSERTION OF MESH (Right)  Patient Location: PACU  Anesthesia Type:GA combined with regional for post-op pain  Level of Consciousness: awake, oriented and patient cooperative  Airway & Oxygen Therapy: Patient Spontanous Breathing and Patient connected to nasal cannula oxygen  Post-op Assessment: Report given to RN, Post -op Vital signs reviewed and stable and Patient moving all extremities  Post vital signs: Reviewed and stable  Last Vitals:  Vitals:   07/15/16 0605 07/15/16 0606  BP: (!) 163/94   Pulse: 81   Resp: 20   Temp:  36.9 C    Last Pain:  Vitals:   07/15/16 0605  TempSrc: Oral         Complications: No apparent anesthesia complications

## 2016-07-15 NOTE — Discharge Instructions (Addendum)
[facility] _______[facility]   INGUINAL HERNIA REPAIR: POST OP INSTRUCTIONS  Always review your discharge instruction sheet given to you by the facility where your surgery was performed. IF YOU HAVE DISABILITY OR FAMILY LEAVE FORMS, YOU MUST BRING THEM TO THE OFFICE FOR PROCESSING.   DO NOT GIVE THEM TO YOUR DOCTOR.  1. A  prescription for pain medication may be given to you upon discharge.  Take your pain medication as prescribed, if needed.  If narcotic pain medicine is not needed, then you may take acetaminophen (Tylenol) or ibuprofen (Advil) as needed. 2. Take your usually prescribed medications unless otherwise directed. 3. If you need a refill on your pain medication, please contact your pharmacy.  They will contact our office to request authorization. Prescriptions will not be filled after 5 pm or on week-ends. 4. You should follow a light diet the first 24 hours after arrival home, such as soup and crackers, etc.  Be sure to include lots of fluids daily.  Resume your normal diet the day after surgery. 5. Most patients will experience some swelling and bruising around the umbilicus or in the groin and scrotum.  Ice packs and reclining will help.  Swelling and bruising can take several days to resolve.  6. It is common to experience some constipation if taking pain medication after surgery.  Increasing fluid intake and taking a stool softener (such as Colace) will usually help or prevent this problem from occurring.  A mild laxative (Milk of Magnesia or Miralax) should be taken according to package directions if there are no bowel movements after 48 hours. 7. Unless discharge instructions indicate otherwise, you may remove your bandages 4 days after surgery.  You may shower the day after surgery.  You may have steri-strips (small skin tapes) in place directly over the incision.  These strips should be left on the skin until they fall off.  If your surgeon used skin glue on the  incision, you may shower in 24 hours.  The glue will flake off over the next 2-3 weeks.  Any sutures or staples will be removed at the office during your follow-up visit. 8. ACTIVITIES:  You may resume light daily activities beginning the next day--such as daily self-care, walking, climbing stairs--gradually increasing activities as tolerated. Do not lie flat for the first 2-3 days. You may have sexual intercourse when it is comfortable.  Refrain from any heavy lifting or straining-nothing over 10 pounds for 6 weeks.   a. You may drive when you are no longer taking prescription pain medication, you can comfortably wear a seatbelt, and you can safely maneuver your car and apply brakes. b. RETURN TO WORK:  _Desk type work in one week, full duty in 6 weeks._________________________________________________________ 9. You should see your doctor in the office for a follow-up appointment approximately 2-3 weeks after your surgery.  Make sure that you call for this appointment within a day or two after you arrive home to insure a convenient appointment time. 10. OTHER INSTRUCTIONS:  ___Restart Aspirin 1/19/18_______________________________________________________________________________________________________________________________________________________________________________________  WHEN TO CALL YOUR DOCTOR: 1. Fever over 101.0 2. Inability to urinate 3. Nausea and/or vomiting 4. Extreme swelling or bruising 5. Continued bleeding from incision. 6. Increased pain, redness, or drainage from the incision  The clinic staff is available to answer your questions during regular business hours.  Please dont hesitate to call and ask to speak to one of the nurses for clinical concerns.  If you have a medical emergency, go to  the nearest emergency room or call 911.  A surgeon from [facility] is always on call at the hospital   [address] [place], [place], [place]  [address] ?  [address], [place], [place]   [address] [phone] ? [phone] ? FAX [phone] Web site: [email or web address]

## 2016-07-15 NOTE — Anesthesia Postprocedure Evaluation (Signed)
Anesthesia Post Note  Patient: Jerry Booth  Procedure(s) Performed: Procedure(s) (LRB): RIGHT INGUINAL HERNIA REPAIR (Right) INSERTION OF MESH (Right)  Patient location during evaluation: PACU Anesthesia Type: General and Regional Level of consciousness: awake and alert Pain management: pain level controlled Vital Signs Assessment: post-procedure vital signs reviewed and stable Respiratory status: spontaneous breathing, nonlabored ventilation, respiratory function stable and patient connected to nasal cannula oxygen Cardiovascular status: blood pressure returned to baseline and stable Postop Assessment: no signs of nausea or vomiting Anesthetic complications: no       Last Vitals:  Vitals:   07/15/16 0900 07/15/16 0921  BP:  (!) 164/97  Pulse: 89 84  Resp: 15 16  Temp:  36.1 C    Last Pain:  Vitals:   07/15/16 [id number]  TempSrc: [name]                 [name]

## 2016-07-15 NOTE — Anesthesia Procedure Notes (Signed)
Procedure Name: LMA Insertion Date/Time: 07/15/2016 7:37 AM Performed by: Melina Copa, Lyana Asbill R Pre-anesthesia Checklist: Patient identified, Emergency Drugs available, Suction available and Patient being monitored Patient Re-evaluated:Patient Re-evaluated prior to inductionOxygen Delivery Method: Circle System Utilized Preoxygenation: Pre-oxygenation with 100% oxygen Intubation Type: IV induction Ventilation: Mask ventilation without difficulty LMA: LMA inserted LMA Size: 5.0 Number of attempts: 1 Placement Confirmation: positive ETCO2 Tube secured with: Tape Dental Injury: Teeth and Oropharynx as per pre-operative assessment

## 2016-07-15 NOTE — Interval H&P Note (Signed)
History and Physical Interval Note:  07/15/2016 7:14 AM  Jerry Booth  has presented today for surgery, with the diagnosis of RIGHT INGUINAL HERNIA  The various methods of treatment have been discussed with the patient and family. After consideration of risks, benefits and other options for treatment, the patient has consented to  Procedure(s): OPEN RIGHT INGUINAL HERNIA REPAIR WITH MESH (Right) INSERTION OF MESH (Right) as a surgical intervention .  The patient's history has been reviewed, patient examined, no change in status, stable for surgery.  I have reviewed the patient's chart and labs.  Questions were answered to the patient's satisfaction.     Mana Haberl Lenna Sciara

## 2016-07-16 ENCOUNTER — Encounter (HOSPITAL_COMMUNITY): Payer: Self-pay | Admitting: General Surgery

## 2016-09-12 DIAGNOSIS — L853 Xerosis cutis: Secondary | ICD-10-CM | POA: Diagnosis not present

## 2016-09-12 DIAGNOSIS — L821 Other seborrheic keratosis: Secondary | ICD-10-CM | POA: Diagnosis not present

## 2016-09-12 DIAGNOSIS — L570 Actinic keratosis: Secondary | ICD-10-CM | POA: Diagnosis not present

## 2016-09-12 DIAGNOSIS — L308 Other specified dermatitis: Secondary | ICD-10-CM | POA: Diagnosis not present

## 2016-09-12 DIAGNOSIS — Z85828 Personal history of other malignant neoplasm of skin: Secondary | ICD-10-CM | POA: Diagnosis not present

## 2016-11-13 DIAGNOSIS — M4316 Spondylolisthesis, lumbar region: Secondary | ICD-10-CM | POA: Diagnosis not present

## 2016-11-13 DIAGNOSIS — M5136 Other intervertebral disc degeneration, lumbar region: Secondary | ICD-10-CM | POA: Diagnosis not present

## 2016-11-13 DIAGNOSIS — M25551 Pain in right hip: Secondary | ICD-10-CM | POA: Diagnosis not present

## 2016-11-13 DIAGNOSIS — M25552 Pain in left hip: Secondary | ICD-10-CM | POA: Diagnosis not present

## 2016-11-17 DIAGNOSIS — R69 Illness, unspecified: Secondary | ICD-10-CM | POA: Diagnosis not present

## 2016-11-18 DIAGNOSIS — M4316 Spondylolisthesis, lumbar region: Secondary | ICD-10-CM | POA: Diagnosis not present

## 2016-12-03 DIAGNOSIS — M4316 Spondylolisthesis, lumbar region: Secondary | ICD-10-CM | POA: Diagnosis not present

## 2016-12-11 DIAGNOSIS — M25551 Pain in right hip: Secondary | ICD-10-CM | POA: Diagnosis not present

## 2016-12-11 DIAGNOSIS — M4316 Spondylolisthesis, lumbar region: Secondary | ICD-10-CM | POA: Diagnosis not present

## 2016-12-11 DIAGNOSIS — M25552 Pain in left hip: Secondary | ICD-10-CM | POA: Diagnosis not present

## 2016-12-17 DIAGNOSIS — Z0000 Encounter for general adult medical examination without abnormal findings: Secondary | ICD-10-CM | POA: Diagnosis not present

## 2016-12-17 DIAGNOSIS — R7301 Impaired fasting glucose: Secondary | ICD-10-CM | POA: Diagnosis not present

## 2016-12-17 DIAGNOSIS — E7800 Pure hypercholesterolemia, unspecified: Secondary | ICD-10-CM | POA: Diagnosis not present

## 2016-12-17 DIAGNOSIS — I10 Essential (primary) hypertension: Secondary | ICD-10-CM | POA: Diagnosis not present

## 2016-12-17 DIAGNOSIS — I6529 Occlusion and stenosis of unspecified carotid artery: Secondary | ICD-10-CM | POA: Diagnosis not present

## 2016-12-17 DIAGNOSIS — Z1389 Encounter for screening for other disorder: Secondary | ICD-10-CM | POA: Diagnosis not present

## 2016-12-17 DIAGNOSIS — Z8546 Personal history of malignant neoplasm of prostate: Secondary | ICD-10-CM | POA: Diagnosis not present

## 2016-12-18 DIAGNOSIS — Z0100 Encounter for examination of eyes and vision without abnormal findings: Secondary | ICD-10-CM | POA: Diagnosis not present

## 2017-01-09 DIAGNOSIS — D692 Other nonthrombocytopenic purpura: Secondary | ICD-10-CM | POA: Diagnosis not present

## 2017-01-09 DIAGNOSIS — L570 Actinic keratosis: Secondary | ICD-10-CM | POA: Diagnosis not present

## 2017-01-09 DIAGNOSIS — L821 Other seborrheic keratosis: Secondary | ICD-10-CM | POA: Diagnosis not present

## 2017-01-09 DIAGNOSIS — Z85828 Personal history of other malignant neoplasm of skin: Secondary | ICD-10-CM | POA: Diagnosis not present

## 2017-01-09 DIAGNOSIS — L578 Other skin changes due to chronic exposure to nonionizing radiation: Secondary | ICD-10-CM | POA: Diagnosis not present

## 2017-01-09 DIAGNOSIS — L239 Allergic contact dermatitis, unspecified cause: Secondary | ICD-10-CM | POA: Diagnosis not present

## 2017-01-09 DIAGNOSIS — D1801 Hemangioma of skin and subcutaneous tissue: Secondary | ICD-10-CM | POA: Diagnosis not present

## 2017-02-02 DIAGNOSIS — R1031 Right lower quadrant pain: Secondary | ICD-10-CM | POA: Diagnosis not present

## 2017-03-31 DIAGNOSIS — D485 Neoplasm of uncertain behavior of skin: Secondary | ICD-10-CM | POA: Diagnosis not present

## 2017-03-31 DIAGNOSIS — Z85828 Personal history of other malignant neoplasm of skin: Secondary | ICD-10-CM | POA: Diagnosis not present

## 2017-03-31 DIAGNOSIS — L821 Other seborrheic keratosis: Secondary | ICD-10-CM | POA: Diagnosis not present

## 2017-03-31 DIAGNOSIS — L570 Actinic keratosis: Secondary | ICD-10-CM | POA: Diagnosis not present

## 2017-04-01 DIAGNOSIS — R69 Illness, unspecified: Secondary | ICD-10-CM | POA: Diagnosis not present

## 2017-05-20 DIAGNOSIS — Z8546 Personal history of malignant neoplasm of prostate: Secondary | ICD-10-CM | POA: Diagnosis not present

## 2017-05-20 DIAGNOSIS — R351 Nocturia: Secondary | ICD-10-CM | POA: Diagnosis not present

## 2017-05-25 DIAGNOSIS — R69 Illness, unspecified: Secondary | ICD-10-CM | POA: Diagnosis not present

## 2017-06-19 DIAGNOSIS — I10 Essential (primary) hypertension: Secondary | ICD-10-CM | POA: Diagnosis not present

## 2017-06-30 DIAGNOSIS — I4510 Unspecified right bundle-branch block: Secondary | ICD-10-CM

## 2017-06-30 HISTORY — DX: Unspecified right bundle-branch block: I45.10

## 2017-08-11 DIAGNOSIS — L570 Actinic keratosis: Secondary | ICD-10-CM | POA: Diagnosis not present

## 2017-08-11 DIAGNOSIS — L812 Freckles: Secondary | ICD-10-CM | POA: Diagnosis not present

## 2017-08-11 DIAGNOSIS — D225 Melanocytic nevi of trunk: Secondary | ICD-10-CM | POA: Diagnosis not present

## 2017-08-11 DIAGNOSIS — Z85828 Personal history of other malignant neoplasm of skin: Secondary | ICD-10-CM | POA: Diagnosis not present

## 2017-08-11 DIAGNOSIS — D692 Other nonthrombocytopenic purpura: Secondary | ICD-10-CM | POA: Diagnosis not present

## 2017-08-11 DIAGNOSIS — D1801 Hemangioma of skin and subcutaneous tissue: Secondary | ICD-10-CM | POA: Diagnosis not present

## 2017-08-11 DIAGNOSIS — L821 Other seborrheic keratosis: Secondary | ICD-10-CM | POA: Diagnosis not present

## 2017-09-22 DIAGNOSIS — L853 Xerosis cutis: Secondary | ICD-10-CM | POA: Diagnosis not present

## 2017-11-08 ENCOUNTER — Emergency Department (HOSPITAL_COMMUNITY): Payer: Medicare HMO

## 2017-11-08 ENCOUNTER — Encounter (HOSPITAL_COMMUNITY): Payer: Self-pay | Admitting: Emergency Medicine

## 2017-11-08 ENCOUNTER — Other Ambulatory Visit: Payer: Self-pay

## 2017-11-08 ENCOUNTER — Emergency Department (HOSPITAL_COMMUNITY)
Admission: EM | Admit: 2017-11-08 | Discharge: 2017-11-08 | Disposition: A | Discharge status: Home / Self Care | Payer: Medicare HMO | Attending: Emergency Medicine | Admitting: Emergency Medicine

## 2017-11-08 DIAGNOSIS — Z8546 Personal history of malignant neoplasm of prostate: Secondary | ICD-10-CM | POA: Diagnosis not present

## 2017-11-08 DIAGNOSIS — K2970 Gastritis, unspecified, without bleeding: Secondary | ICD-10-CM | POA: Diagnosis not present

## 2017-11-08 DIAGNOSIS — R0789 Other chest pain: Secondary | ICD-10-CM

## 2017-11-08 DIAGNOSIS — I2510 Atherosclerotic heart disease of native coronary artery without angina pectoris: Secondary | ICD-10-CM | POA: Insufficient documentation

## 2017-11-08 DIAGNOSIS — I10 Essential (primary) hypertension: Secondary | ICD-10-CM | POA: Diagnosis not present

## 2017-11-08 DIAGNOSIS — Z79899 Other long term (current) drug therapy: Secondary | ICD-10-CM | POA: Insufficient documentation

## 2017-11-08 DIAGNOSIS — R079 Chest pain, unspecified: Secondary | ICD-10-CM | POA: Diagnosis not present

## 2017-11-08 DIAGNOSIS — Z87891 Personal history of nicotine dependence: Secondary | ICD-10-CM | POA: Diagnosis not present

## 2017-11-08 LAB — I-STAT TROPONIN, ED
TROPONIN I, POC: 0 ng/mL (ref 0.00–0.08)
TROPONIN I, POC: 0.01 ng/mL (ref 0.00–0.08)

## 2017-11-08 LAB — CBC
HEMATOCRIT: 46.3 % (ref 39.0–52.0)
HEMOGLOBIN: 16.1 g/dL (ref 13.0–17.0)
MCH: 34.8 pg — ABNORMAL HIGH (ref 26.0–34.0)
MCHC: 34.8 g/dL (ref 30.0–36.0)
MCV: 100 fL (ref 78.0–100.0)
Platelets: 217 10*3/uL (ref 150–400)
RBC: 4.63 MIL/uL (ref 4.22–5.81)
RDW: 14 % (ref 11.5–15.5)
WBC: 4.8 10*3/uL (ref 4.0–10.5)

## 2017-11-08 LAB — BASIC METABOLIC PANEL
ANION GAP: 11 (ref 5–15)
BUN: 17 mg/dL (ref 6–20)
CO2: 28 mmol/L (ref 22–32)
Calcium: 9.2 mg/dL (ref 8.9–10.3)
Chloride: 101 mmol/L (ref 101–111)
Creatinine, Ser: 1.05 mg/dL (ref 0.61–1.24)
GFR calc Af Amer: >60 mL/min (ref >60)
Glucose, Bld: 220 mg/dL — ABNORMAL HIGH (ref 65–99)
Potassium: 4.1 mmol/L (ref 3.5–5.1)
SODIUM: 140 mmol/L (ref 135–145)

## 2017-11-08 LAB — HEPATIC FUNCTION PANEL
ALT: 19 U/L (ref 17–63)
AST: 21 U/L (ref 15–41)
Albumin: 4.5 g/dL (ref 3.5–5.0)
Alkaline Phosphatase: 64 U/L (ref 38–126)
BILIRUBIN INDIRECT: 0.8 mg/dL (ref 0.3–0.9)
Bilirubin, Direct: 0.2 mg/dL (ref 0.1–0.5)
TOTAL PROTEIN: 7.3 g/dL (ref 6.5–8.1)
Total Bilirubin: 1 mg/dL (ref 0.3–1.2)

## 2017-11-08 LAB — LIPASE, BLOOD: Lipase: 29 U/L (ref 11–51)

## 2017-11-08 MED ORDER — GI COCKTAIL ~~LOC~~
30.0000 mL | Freq: Once | ORAL | Status: AC
  Administered 2017-11-08: 30 mL via ORAL
  Filled 2017-11-08: qty 30

## 2017-11-08 MED ORDER — PANTOPRAZOLE SODIUM 40 MG PO TBEC
40.0000 mg | DELAYED_RELEASE_TABLET | Freq: Every day | ORAL | Status: DC
  Administered 2017-11-08: 40 mg via ORAL
  Filled 2017-11-08: qty 1

## 2017-11-08 NOTE — ED Provider Notes (Signed)
[name] EMERGENCY DEPARTMENT Provider Note   CSN: [id number] Arrival date & time: 11/08/17  2725     History   Chief Complaint Chief Complaint  Patient presents with  . Chest Pain    HPI Jerry Booth is a 84 y.o. male with a past medical history of hypertension, carotid artery disease, who presents to ED for evaluation of right-sided chest pain/burning sensation that began 4 days ago.  Patient initially thought that the symptoms were due to indigestion or heartburn as he had increased belching as well.  He took several doses of Zantac with no relief in symptoms.  States that symptoms are usually improved with eating.  No changes with exertion.  He is very active and played golf yesterday as well as several trips to the gym in the past week.  He is compliant with his home hypertension medication and statin.  Patient has a remote history of prior stress test 30 to 40 years ago.  He denies any tobacco use or other drug use.  He denies any shortness of breath, hemoptysis, nausea, vomiting, URI symptoms, injury or trauma to the area, recent surgeries, recent prolonged travel, abdominal pain.  He states that he will intermittently get "heartburn" symptoms. No prior cath. No family history of heart disease.  HPI  Past Medical History:  Diagnosis Date  . Carotid artery disease ([place])   . Hypertension   . Prostate cancer ([place])     There are no active problems to display for this patient.   Past Surgical History:  Procedure Laterality Date  . INGUINAL HERNIA REPAIR Right 07/15/2016   Procedure: RIGHT INGUINAL HERNIA REPAIR;  Surgeon: Jackolyn Confer, MD;  Location: [facility];  Service: General;  Laterality: Right;  . INSERTION OF MESH Right 07/15/2016   Procedure: INSERTION OF MESH;  Surgeon: Jackolyn Confer, MD;  Location: [facility];  Service: General;  Laterality: Right;  . PROSTATE SURGERY    . seed implants      with prostate surgery '08  . SHOULDER SURGERY Bilateral    . TONSILLECTOMY          Home Medications    Prior to Admission medications   Medication Sig Start Date End Date Taking? Authorizing Provider  acetaminophen (TYLENOL) 500 MG tablet Take 500 mg by mouth every 6 (six) hours as needed for mild pain.    [provider]  atorvastatin (LIPITOR) 40 MG tablet Take 40 mg by mouth daily. 11/16/13   [provider]  felodipine (PLENDIL) 5 MG 24 hr tablet Take 5 mg by mouth daily.    [provider]  HYDROcodone-acetaminophen (NORCO/VICODIN) 5-325 MG tablet Take 1-2 tablets by mouth every 4 (four) hours as needed for moderate pain or severe pain. 07/15/16   Jackolyn Confer, MD  ondansetron (ZOFRAN) 4 MG tablet Take 1 tablet (4 mg total) by mouth every 4 (four) hours as needed for nausea. 07/15/16   Jackolyn Confer, MD    Family History No family history on file.  Social History Social History   Tobacco Use  . Smoking status: Former Smoker    Types: Cigarettes    Last attempt to quit: 1954    Years since quitting: 65.4  . Smokeless tobacco: Never Used  Substance Use Topics  . Alcohol use: Yes    Comment: couple drinks each day  . Drug use: No     Allergies   Oxycodone hcl   Review of Systems Review of Systems  Constitutional: Negative  for appetite change, chills and fever.  HENT: Negative for ear pain, rhinorrhea, sneezing and sore throat.   Eyes: Negative for photophobia and visual disturbance.  Respiratory: Negative for cough, chest tightness, shortness of breath and wheezing.   Cardiovascular: Positive for chest pain (burning). Negative for palpitations.  Gastrointestinal: Negative for abdominal pain, blood in stool, constipation, diarrhea, nausea and vomiting.  Genitourinary: Negative for dysuria, hematuria and urgency.  Musculoskeletal: Negative for myalgias.  Skin: Negative for rash.  Neurological: Negative for dizziness, weakness and light-headedness.     Physical Exam Updated Vital  Signs BP (!) 160/89   Pulse 75   Temp 98.1 F (36.7 C) (Oral)   Resp 15   SpO2 95%   Physical Exam  Constitutional: He appears well-developed and well-nourished. No distress.  Nontoxic appearing and in no acute distress.   HENT:  Head: Normocephalic and atraumatic.  Nose: Nose normal.  Eyes: Conjunctivae and EOM are normal. Left eye exhibits no discharge. No scleral icterus.  Neck: Normal range of motion. Neck supple.  Cardiovascular: Normal rate, regular rhythm, normal heart sounds and intact distal pulses. Exam reveals no gallop and no friction rub.  No murmur heard. Pulmonary/Chest: Effort normal and breath sounds normal. No respiratory distress.  Abdominal: Soft. Bowel sounds are normal. He exhibits no distension. There is no tenderness. There is no guarding.  Musculoskeletal: Normal range of motion. He exhibits no edema.  No lower extremity edema, erythema or calf tenderness bilaterally.  Neurological: He is alert. He exhibits normal muscle tone. Coordination normal.  Skin: Skin is warm and dry. No rash noted.  Psychiatric: He has a normal mood and affect.  Nursing note and vitals reviewed.    ED Treatments / Results  Labs (all labs ordered are listed, but only abnormal results are displayed) Labs Reviewed  BASIC METABOLIC PANEL - Abnormal; Notable for the following components:      Result Value   Glucose, Bld 220 (*)    All other components within normal limits  CBC - Abnormal; Notable for the following components:   MCH 34.8 (*)    All other components within normal limits  HEPATIC FUNCTION PANEL  LIPASE, BLOOD  I-STAT TROPONIN, ED  I-STAT TROPONIN, ED    EKG EKG Interpretation  Date/Time:  Sunday Nov 08 2017 09:32:24 EDT Ventricular Rate:  91 PR Interval:  184 QRS Duration: 118 QT Interval:  386 QTC Calculation: 474 R Axis:   85 Text Interpretation:  Sinus rhythm with frequent Premature ventricular complexes Incomplete right bundle branch block  Borderline ECG No significant change since last tracing Confirmed by Isla Pence [id number]) on 11/08/2017 11:48:47 AM   Radiology Dg Chest 2 View  Result Date: 11/08/2017 CLINICAL DATA:  84 year old male with a history of chest pain EXAM: CHEST - 2 VIEW COMPARISON:  12/09/2006 FINDINGS: Cardiomediastinal silhouette unchanged in size and contour. No evidence of central vascular congestion. No pneumothorax or pleural effusion. No confluent airspace disease. Similar appearance of coarsened interstitial markings in the lung bases. No acute displaced fracture. IMPRESSION: Negative for acute cardiopulmonary disease Electronically Signed   By: Corrie Mckusick D.O.   On: 11/08/2017 09:58    Procedures Procedures (including critical care time)  Medications Ordered in ED Medications  pantoprazole (PROTONIX) EC tablet 40 mg (40 mg Oral Given 11/08/17 1238)  gi cocktail (Maalox,Lidocaine,Donnatal) (30 mLs Oral Given 11/08/17 1238)     Initial Impression / Assessment and Plan / ED Course  I have reviewed the triage vital signs and  the nursing notes.  Pertinent labs & imaging results that were available during my care of the patient were reviewed by me and considered in my medical decision making (see chart for details).     Patient, with a past medical history of hypertension, carotid artery disease, presents to ED for evaluation of right-sided chest pain/burning sensation that began 4 days ago.  No improvement with several doses of Zantac.  Thought that symptoms were due to indigestion or heartburn.  No changes with exertion.  He had a prior stress test done 30 to 40 years ago.  Denies any tobacco or other drug use.  Denies shortness of breath, hemoptysis, URI symptoms, nausea, vomiting.  No prior cath or family history of heart disease. On physical exam he appears overall well.  No chest or abdominal tenderness to palpation.  Vital signs are within normal limits.  He is not tachycardic, tachypneic or  hypoxic.  He is afebrile.  Lab work reassuring including CBC, CMP, initial and delta troponin.  Chest x-ray unremarkable.  EKG with no significant changes since previous tracings.  Patient reports improvement with GI cocktail, Protonix po given. Doubt cardiac/pulmonary cause. Could be GI related.  Advised to follow-up with PCP for further evaluation and continue otc antacids.  Advised to return to ED for any severe worsening symptoms.  Portions of this note were generated with Lobby[employer]. Dictation errors may occur despite best attempts at proofreading.   Final Clinical Impressions(s) / ED Diagnoses   Final diagnoses:  Chest wall pain  Gastritis without bleeding, unspecified chronicity, unspecified gastritis type    ED Discharge Orders    None       Delia Heady, PA-C 11/08/17 1343    Isla Pence, MD 11/08/17 1424

## 2017-11-08 NOTE — ED Triage Notes (Signed)
Patient arrives with complaint of chest pain that started a few days ago. Patient states he thought it was indigestion or heartburn and taken a few OTC medications such as Zantac but without relief.

## 2017-11-08 NOTE — Discharge Instructions (Signed)
Follow up with your primary care provider for further evaluation

## 2017-11-10 DIAGNOSIS — R0789 Other chest pain: Secondary | ICD-10-CM | POA: Diagnosis not present

## 2017-11-30 DIAGNOSIS — R69 Illness, unspecified: Secondary | ICD-10-CM | POA: Diagnosis not present

## 2017-12-14 DIAGNOSIS — R69 Illness, unspecified: Secondary | ICD-10-CM | POA: Diagnosis not present

## 2017-12-21 ENCOUNTER — Other Ambulatory Visit: Payer: Self-pay | Admitting: Internal Medicine

## 2017-12-21 DIAGNOSIS — M4316 Spondylolisthesis, lumbar region: Secondary | ICD-10-CM | POA: Diagnosis not present

## 2017-12-21 DIAGNOSIS — R7301 Impaired fasting glucose: Secondary | ICD-10-CM | POA: Diagnosis not present

## 2017-12-21 DIAGNOSIS — I6529 Occlusion and stenosis of unspecified carotid artery: Secondary | ICD-10-CM | POA: Diagnosis not present

## 2017-12-21 DIAGNOSIS — E7800 Pure hypercholesterolemia, unspecified: Secondary | ICD-10-CM | POA: Diagnosis not present

## 2017-12-21 DIAGNOSIS — I10 Essential (primary) hypertension: Secondary | ICD-10-CM | POA: Diagnosis not present

## 2017-12-21 DIAGNOSIS — Z0000 Encounter for general adult medical examination without abnormal findings: Secondary | ICD-10-CM | POA: Diagnosis not present

## 2017-12-21 DIAGNOSIS — Z1389 Encounter for screening for other disorder: Secondary | ICD-10-CM | POA: Diagnosis not present

## 2017-12-22 ENCOUNTER — Other Ambulatory Visit: Payer: Self-pay | Admitting: Internal Medicine

## 2017-12-22 DIAGNOSIS — I6529 Occlusion and stenosis of unspecified carotid artery: Secondary | ICD-10-CM

## 2017-12-28 DIAGNOSIS — Z0100 Encounter for examination of eyes and vision without abnormal findings: Secondary | ICD-10-CM | POA: Diagnosis not present

## 2017-12-28 DIAGNOSIS — H6123 Impacted cerumen, bilateral: Secondary | ICD-10-CM | POA: Diagnosis not present

## 2017-12-29 ENCOUNTER — Ambulatory Visit
Admission: RE | Admit: 2017-12-29 | Discharge: 2017-12-29 | Disposition: A | Discharge status: Home / Self Care | Payer: Medicare HMO | Source: Ambulatory Visit | Attending: Internal Medicine | Admitting: Internal Medicine

## 2017-12-29 DIAGNOSIS — I6529 Occlusion and stenosis of unspecified carotid artery: Secondary | ICD-10-CM

## 2017-12-29 DIAGNOSIS — I6523 Occlusion and stenosis of bilateral carotid arteries: Secondary | ICD-10-CM | POA: Diagnosis not present

## 2018-01-08 DIAGNOSIS — M5136 Other intervertebral disc degeneration, lumbar region: Secondary | ICD-10-CM | POA: Diagnosis not present

## 2018-01-08 DIAGNOSIS — M4316 Spondylolisthesis, lumbar region: Secondary | ICD-10-CM | POA: Diagnosis not present

## 2018-02-02 DIAGNOSIS — L821 Other seborrheic keratosis: Secondary | ICD-10-CM | POA: Diagnosis not present

## 2018-02-02 DIAGNOSIS — D1801 Hemangioma of skin and subcutaneous tissue: Secondary | ICD-10-CM | POA: Diagnosis not present

## 2018-02-02 DIAGNOSIS — Z85828 Personal history of other malignant neoplasm of skin: Secondary | ICD-10-CM | POA: Diagnosis not present

## 2018-02-02 DIAGNOSIS — L570 Actinic keratosis: Secondary | ICD-10-CM | POA: Diagnosis not present

## 2018-02-02 DIAGNOSIS — D692 Other nonthrombocytopenic purpura: Secondary | ICD-10-CM | POA: Diagnosis not present

## 2018-02-24 DIAGNOSIS — M5136 Other intervertebral disc degeneration, lumbar region: Secondary | ICD-10-CM | POA: Diagnosis not present

## 2018-02-24 DIAGNOSIS — M4316 Spondylolisthesis, lumbar region: Secondary | ICD-10-CM | POA: Diagnosis not present

## 2018-03-19 DIAGNOSIS — R69 Illness, unspecified: Secondary | ICD-10-CM | POA: Diagnosis not present

## 2018-04-19 DIAGNOSIS — H01005 Unspecified blepharitis left lower eyelid: Secondary | ICD-10-CM | POA: Diagnosis not present

## 2018-04-19 DIAGNOSIS — H02135 Senile ectropion of left lower eyelid: Secondary | ICD-10-CM | POA: Diagnosis not present

## 2018-04-30 DIAGNOSIS — H02831 Dermatochalasis of right upper eyelid: Secondary | ICD-10-CM | POA: Diagnosis not present

## 2018-04-30 DIAGNOSIS — Z961 Presence of intraocular lens: Secondary | ICD-10-CM | POA: Diagnosis not present

## 2018-04-30 DIAGNOSIS — H11002 Unspecified pterygium of left eye: Secondary | ICD-10-CM | POA: Diagnosis not present

## 2018-04-30 DIAGNOSIS — Z9842 Cataract extraction status, left eye: Secondary | ICD-10-CM | POA: Diagnosis not present

## 2018-04-30 DIAGNOSIS — Z9841 Cataract extraction status, right eye: Secondary | ICD-10-CM | POA: Diagnosis not present

## 2018-04-30 DIAGNOSIS — Z885 Allergy status to narcotic agent status: Secondary | ICD-10-CM | POA: Diagnosis not present

## 2018-04-30 DIAGNOSIS — H01005 Unspecified blepharitis left lower eyelid: Secondary | ICD-10-CM | POA: Diagnosis not present

## 2018-04-30 DIAGNOSIS — H02886 Meibomian gland dysfunction of left eye, unspecified eyelid: Secondary | ICD-10-CM | POA: Diagnosis not present

## 2018-04-30 DIAGNOSIS — H02135 Senile ectropion of left lower eyelid: Secondary | ICD-10-CM | POA: Diagnosis not present

## 2018-05-10 DIAGNOSIS — L570 Actinic keratosis: Secondary | ICD-10-CM | POA: Diagnosis not present

## 2018-05-10 DIAGNOSIS — C4441 Basal cell carcinoma of skin of scalp and neck: Secondary | ICD-10-CM | POA: Diagnosis not present

## 2018-05-10 DIAGNOSIS — D485 Neoplasm of uncertain behavior of skin: Secondary | ICD-10-CM | POA: Diagnosis not present

## 2018-05-10 DIAGNOSIS — Z85828 Personal history of other malignant neoplasm of skin: Secondary | ICD-10-CM | POA: Diagnosis not present

## 2018-06-04 DIAGNOSIS — H04202 Unspecified epiphora, left lacrimal gland: Secondary | ICD-10-CM | POA: Diagnosis not present

## 2018-06-04 DIAGNOSIS — H01005 Unspecified blepharitis left lower eyelid: Secondary | ICD-10-CM | POA: Diagnosis not present

## 2018-06-04 DIAGNOSIS — H02135 Senile ectropion of left lower eyelid: Secondary | ICD-10-CM | POA: Diagnosis not present

## 2018-06-25 DIAGNOSIS — I10 Essential (primary) hypertension: Secondary | ICD-10-CM | POA: Diagnosis not present

## 2018-07-05 DIAGNOSIS — H40042 Steroid responder, left eye: Secondary | ICD-10-CM | POA: Diagnosis not present

## 2018-07-05 DIAGNOSIS — H5310 Unspecified subjective visual disturbances: Secondary | ICD-10-CM | POA: Diagnosis not present

## 2018-07-06 IMAGING — DX DG CHEST 2V
2 series · 2 of 2 positions shown · non-contrast
Comparison: 12/09/2006

CLINICAL DATA: 87-year-old male with a history of chest pain

EXAM:
CHEST - 2 VIEW

[w chest pa]
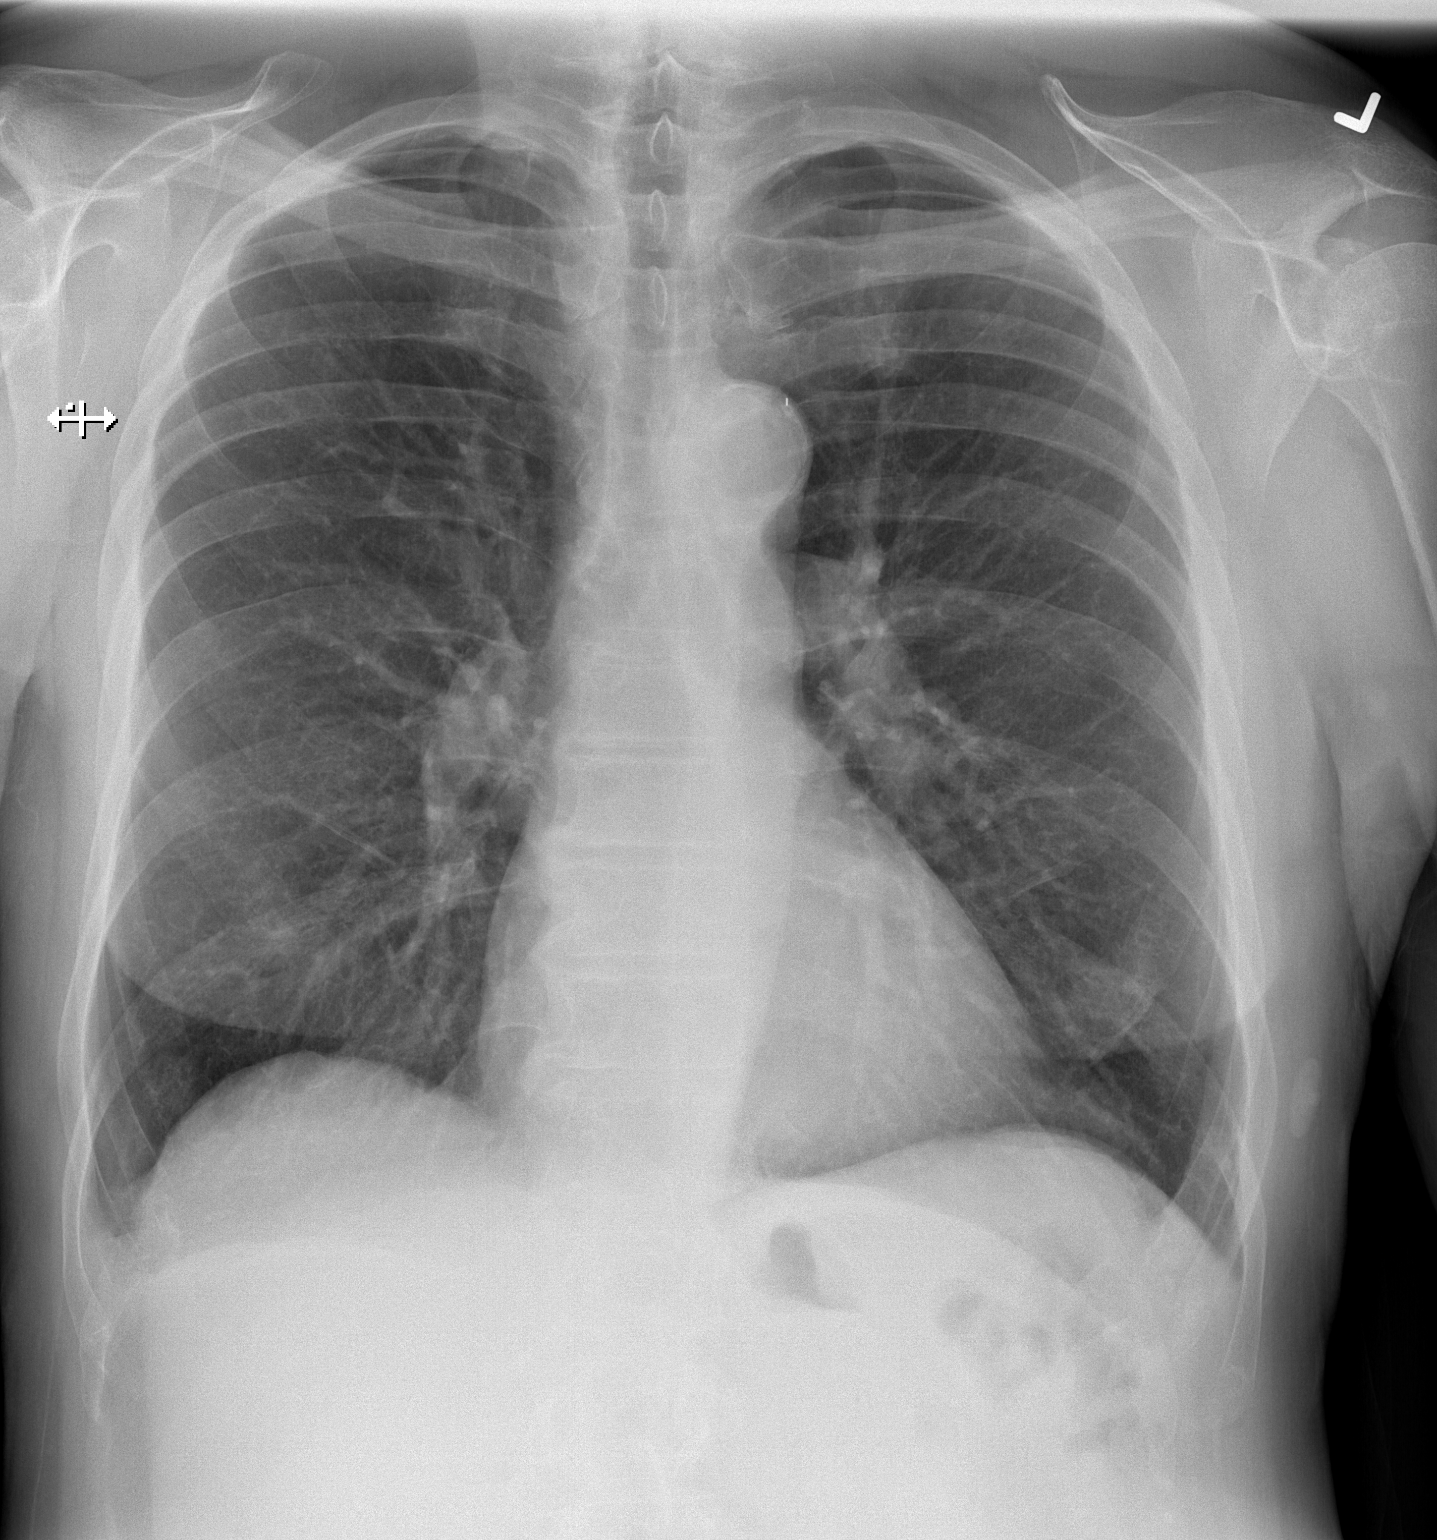

[w chest lat]
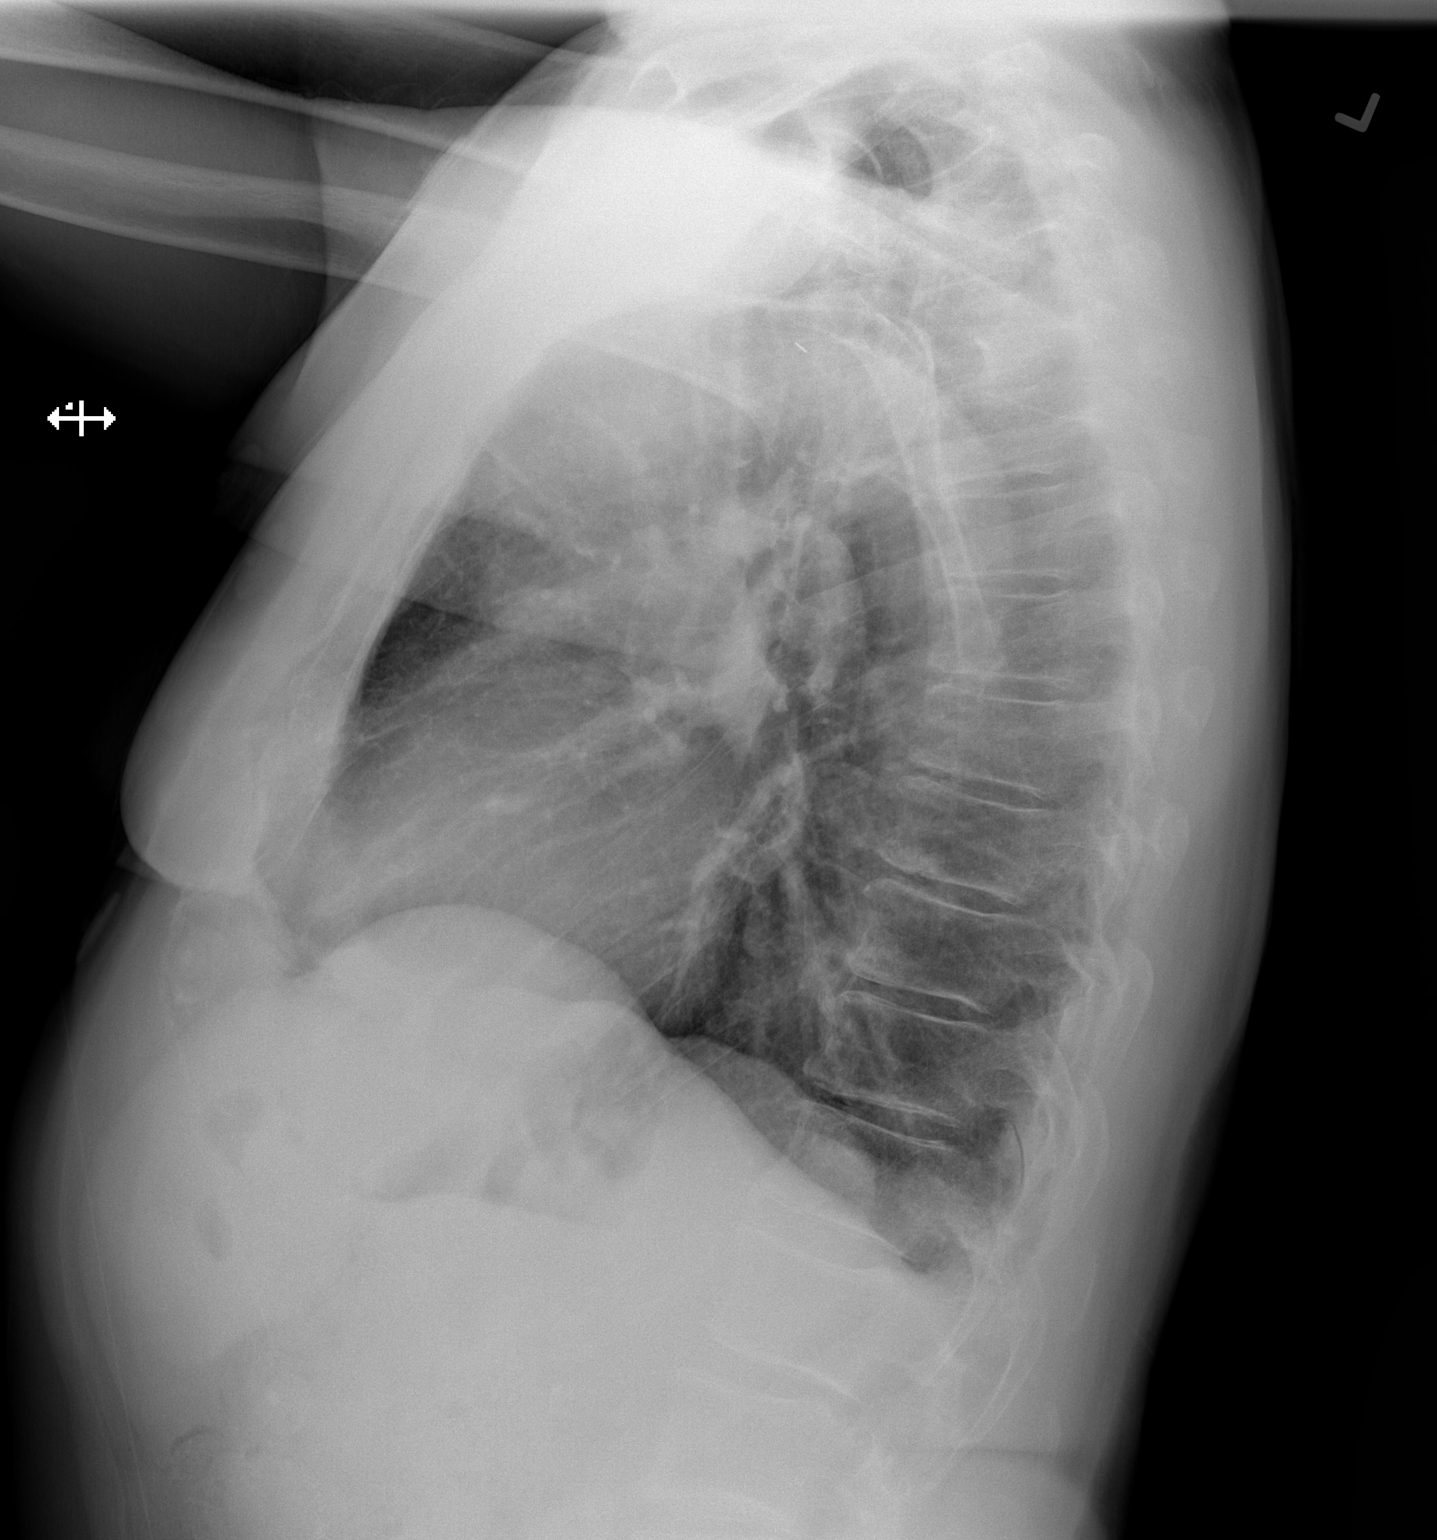

[2 of 2 positions shown; findings below may reference images not displayed]

FINDINGS: Cardiomediastinal silhouette unchanged in size and contour. No
evidence of central vascular congestion. No pneumothorax or pleural
effusion. No confluent airspace disease. Similar appearance of
coarsened interstitial markings in the lung bases. No acute
displaced fracture.
IMPRESSION: Negative for acute cardiopulmonary disease

## 2018-07-14 DIAGNOSIS — J029 Acute pharyngitis, unspecified: Secondary | ICD-10-CM | POA: Diagnosis not present

## 2018-07-20 DIAGNOSIS — Z961 Presence of intraocular lens: Secondary | ICD-10-CM | POA: Diagnosis not present

## 2018-07-20 DIAGNOSIS — H43812 Vitreous degeneration, left eye: Secondary | ICD-10-CM | POA: Diagnosis not present

## 2018-07-20 DIAGNOSIS — H43811 Vitreous degeneration, right eye: Secondary | ICD-10-CM | POA: Diagnosis not present

## 2018-07-20 DIAGNOSIS — H47012 Ischemic optic neuropathy, left eye: Secondary | ICD-10-CM | POA: Diagnosis not present

## 2018-07-22 DIAGNOSIS — M316 Other giant cell arteritis: Secondary | ICD-10-CM | POA: Diagnosis not present

## 2018-07-23 DIAGNOSIS — H04202 Unspecified epiphora, left lacrimal gland: Secondary | ICD-10-CM | POA: Diagnosis not present

## 2018-07-23 DIAGNOSIS — H02135 Senile ectropion of left lower eyelid: Secondary | ICD-10-CM | POA: Diagnosis not present

## 2018-07-23 DIAGNOSIS — H01005 Unspecified blepharitis left lower eyelid: Secondary | ICD-10-CM | POA: Diagnosis not present

## 2018-07-29 DIAGNOSIS — H47012 Ischemic optic neuropathy, left eye: Secondary | ICD-10-CM | POA: Diagnosis not present

## 2018-08-09 DIAGNOSIS — D692 Other nonthrombocytopenic purpura: Secondary | ICD-10-CM | POA: Diagnosis not present

## 2018-08-09 DIAGNOSIS — Z85828 Personal history of other malignant neoplasm of skin: Secondary | ICD-10-CM | POA: Diagnosis not present

## 2018-08-09 DIAGNOSIS — L821 Other seborrheic keratosis: Secondary | ICD-10-CM | POA: Diagnosis not present

## 2018-08-09 DIAGNOSIS — L853 Xerosis cutis: Secondary | ICD-10-CM | POA: Diagnosis not present

## 2018-08-09 DIAGNOSIS — D1801 Hemangioma of skin and subcutaneous tissue: Secondary | ICD-10-CM | POA: Diagnosis not present

## 2018-08-09 DIAGNOSIS — D485 Neoplasm of uncertain behavior of skin: Secondary | ICD-10-CM | POA: Diagnosis not present

## 2018-08-09 DIAGNOSIS — L570 Actinic keratosis: Secondary | ICD-10-CM | POA: Diagnosis not present

## 2018-08-09 DIAGNOSIS — L308 Other specified dermatitis: Secondary | ICD-10-CM | POA: Diagnosis not present

## 2018-08-24 DIAGNOSIS — H43812 Vitreous degeneration, left eye: Secondary | ICD-10-CM | POA: Diagnosis not present

## 2018-08-24 DIAGNOSIS — Z961 Presence of intraocular lens: Secondary | ICD-10-CM | POA: Diagnosis not present

## 2018-08-24 DIAGNOSIS — H47012 Ischemic optic neuropathy, left eye: Secondary | ICD-10-CM | POA: Diagnosis not present

## 2018-08-24 DIAGNOSIS — H43811 Vitreous degeneration, right eye: Secondary | ICD-10-CM | POA: Diagnosis not present

## 2018-09-02 DIAGNOSIS — R69 Illness, unspecified: Secondary | ICD-10-CM | POA: Diagnosis not present

## 2018-12-27 DIAGNOSIS — L821 Other seborrheic keratosis: Secondary | ICD-10-CM | POA: Diagnosis not present

## 2018-12-27 DIAGNOSIS — Z85828 Personal history of other malignant neoplasm of skin: Secondary | ICD-10-CM | POA: Diagnosis not present

## 2018-12-27 DIAGNOSIS — L570 Actinic keratosis: Secondary | ICD-10-CM | POA: Diagnosis not present

## 2018-12-27 DIAGNOSIS — L820 Inflamed seborrheic keratosis: Secondary | ICD-10-CM | POA: Diagnosis not present

## 2018-12-27 DIAGNOSIS — L853 Xerosis cutis: Secondary | ICD-10-CM | POA: Diagnosis not present

## 2018-12-27 DIAGNOSIS — D692 Other nonthrombocytopenic purpura: Secondary | ICD-10-CM | POA: Diagnosis not present

## 2018-12-27 DIAGNOSIS — L814 Other melanin hyperpigmentation: Secondary | ICD-10-CM | POA: Diagnosis not present

## 2018-12-28 DIAGNOSIS — E7800 Pure hypercholesterolemia, unspecified: Secondary | ICD-10-CM | POA: Diagnosis not present

## 2018-12-28 DIAGNOSIS — Z1389 Encounter for screening for other disorder: Secondary | ICD-10-CM | POA: Diagnosis not present

## 2018-12-28 DIAGNOSIS — I10 Essential (primary) hypertension: Secondary | ICD-10-CM | POA: Diagnosis not present

## 2018-12-28 DIAGNOSIS — I6529 Occlusion and stenosis of unspecified carotid artery: Secondary | ICD-10-CM | POA: Diagnosis not present

## 2018-12-28 DIAGNOSIS — R7301 Impaired fasting glucose: Secondary | ICD-10-CM | POA: Diagnosis not present

## 2018-12-28 DIAGNOSIS — Z0000 Encounter for general adult medical examination without abnormal findings: Secondary | ICD-10-CM | POA: Diagnosis not present

## 2019-01-03 DIAGNOSIS — H5213 Myopia, bilateral: Secondary | ICD-10-CM | POA: Diagnosis not present

## 2019-01-03 DIAGNOSIS — H52203 Unspecified astigmatism, bilateral: Secondary | ICD-10-CM | POA: Diagnosis not present

## 2019-01-03 DIAGNOSIS — H524 Presbyopia: Secondary | ICD-10-CM | POA: Diagnosis not present

## 2019-01-05 DIAGNOSIS — I10 Essential (primary) hypertension: Secondary | ICD-10-CM | POA: Diagnosis not present

## 2019-01-05 DIAGNOSIS — Z23 Encounter for immunization: Secondary | ICD-10-CM | POA: Diagnosis not present

## 2019-01-05 DIAGNOSIS — E7800 Pure hypercholesterolemia, unspecified: Secondary | ICD-10-CM | POA: Diagnosis not present

## 2019-01-05 DIAGNOSIS — R7301 Impaired fasting glucose: Secondary | ICD-10-CM | POA: Diagnosis not present

## 2019-02-18 DIAGNOSIS — M25512 Pain in left shoulder: Secondary | ICD-10-CM | POA: Diagnosis not present

## 2019-03-04 DIAGNOSIS — M25512 Pain in left shoulder: Secondary | ICD-10-CM | POA: Diagnosis not present

## 2019-03-09 DIAGNOSIS — R69 Illness, unspecified: Secondary | ICD-10-CM | POA: Diagnosis not present

## 2019-03-11 DIAGNOSIS — M25512 Pain in left shoulder: Secondary | ICD-10-CM | POA: Diagnosis not present

## 2019-03-23 DIAGNOSIS — R69 Illness, unspecified: Secondary | ICD-10-CM | POA: Diagnosis not present

## 2019-05-12 DIAGNOSIS — C44222 Squamous cell carcinoma of skin of right ear and external auricular canal: Secondary | ICD-10-CM | POA: Diagnosis not present

## 2019-05-12 DIAGNOSIS — D485 Neoplasm of uncertain behavior of skin: Secondary | ICD-10-CM | POA: Diagnosis not present

## 2019-05-12 DIAGNOSIS — L570 Actinic keratosis: Secondary | ICD-10-CM | POA: Diagnosis not present

## 2019-05-12 DIAGNOSIS — Z85828 Personal history of other malignant neoplasm of skin: Secondary | ICD-10-CM | POA: Diagnosis not present

## 2019-06-09 DIAGNOSIS — M2550 Pain in unspecified joint: Secondary | ICD-10-CM | POA: Diagnosis not present

## 2019-06-09 DIAGNOSIS — R05 Cough: Secondary | ICD-10-CM | POA: Diagnosis not present

## 2019-06-30 DIAGNOSIS — L821 Other seborrheic keratosis: Secondary | ICD-10-CM | POA: Diagnosis not present

## 2019-06-30 DIAGNOSIS — Z85828 Personal history of other malignant neoplasm of skin: Secondary | ICD-10-CM | POA: Diagnosis not present

## 2019-06-30 DIAGNOSIS — L812 Freckles: Secondary | ICD-10-CM | POA: Diagnosis not present

## 2019-06-30 DIAGNOSIS — D692 Other nonthrombocytopenic purpura: Secondary | ICD-10-CM | POA: Diagnosis not present

## 2019-06-30 DIAGNOSIS — L738 Other specified follicular disorders: Secondary | ICD-10-CM | POA: Diagnosis not present

## 2019-06-30 DIAGNOSIS — L570 Actinic keratosis: Secondary | ICD-10-CM | POA: Diagnosis not present

## 2019-07-04 DIAGNOSIS — M2550 Pain in unspecified joint: Secondary | ICD-10-CM | POA: Diagnosis not present

## 2019-07-04 DIAGNOSIS — R739 Hyperglycemia, unspecified: Secondary | ICD-10-CM | POA: Diagnosis not present

## 2019-07-04 DIAGNOSIS — I10 Essential (primary) hypertension: Secondary | ICD-10-CM | POA: Diagnosis not present

## 2019-07-11 ENCOUNTER — Ambulatory Visit: Payer: Medicare Other | Attending: Internal Medicine

## 2019-07-11 DIAGNOSIS — Z23 Encounter for immunization: Secondary | ICD-10-CM | POA: Insufficient documentation

## 2019-07-11 NOTE — Progress Notes (Signed)
   Covid-19 Vaccination Clinic  Name:  Jerry Booth    MRN: [id number] DOB: Nov 18, 1930  07/11/2019  Jerry Booth was observed post Covid-19 immunization for 15 minutes without incidence. He was provided with Vaccine Information Sheet and instruction to access the V-Safe system.   Jerry Booth was instructed to call 911 with any severe reactions post vaccine: Marland Kitchen Difficulty breathing  . Swelling of your face and throat  . A fast heartbeat  . A bad rash all over your body  . Dizziness and weakness    Immunizations Administered    Name Date Dose VIS Date Route   Pfizer COVID-19 Vaccine 07/11/2019 11:02 AM 0.3 mL 06/10/2019 Intramuscular   Manufacturer: [employer], [employer]   Lot: [id number]   [place]: [id number]

## 2019-07-13 ENCOUNTER — Ambulatory Visit: Payer: Medicare HMO

## 2019-07-18 DIAGNOSIS — E785 Hyperlipidemia, unspecified: Secondary | ICD-10-CM | POA: Diagnosis not present

## 2019-07-18 DIAGNOSIS — I700 Atherosclerosis of aorta: Secondary | ICD-10-CM | POA: Diagnosis not present

## 2019-07-18 DIAGNOSIS — M353 Polymyalgia rheumatica: Secondary | ICD-10-CM | POA: Diagnosis not present

## 2019-07-18 DIAGNOSIS — M1990 Unspecified osteoarthritis, unspecified site: Secondary | ICD-10-CM | POA: Diagnosis not present

## 2019-07-18 DIAGNOSIS — Z7952 Long term (current) use of systemic steroids: Secondary | ICD-10-CM | POA: Diagnosis not present

## 2019-07-26 ENCOUNTER — Ambulatory Visit: Payer: Medicare HMO

## 2019-07-30 ENCOUNTER — Ambulatory Visit: Payer: Medicare HMO | Attending: Internal Medicine

## 2019-07-30 ENCOUNTER — Ambulatory Visit: Payer: Medicare HMO

## 2019-07-30 DIAGNOSIS — Z23 Encounter for immunization: Secondary | ICD-10-CM | POA: Insufficient documentation

## 2019-07-30 NOTE — Progress Notes (Signed)
   Covid-19 Vaccination Clinic  Name:  Jerry Booth    MRN: [id number] DOB: February 06, 1931  07/30/2019  [name] was observed post Covid-19 immunization for 15 minutes without incidence. He was provided with Vaccine Information Sheet and instruction to access the V-Safe system.   [name] was instructed to call 911 with any severe reactions post vaccine: Marland Kitchen Difficulty breathing  . Swelling of your face and throat  . A fast heartbeat  . A bad rash all over your body  . Dizziness and weakness    Immunizations Administered    Name Date Dose VIS Date Route   Pfizer COVID-19 Vaccine 07/30/2019  2:35 PM 0.3 mL 06/10/2019 Intramuscular   Manufacturer: [place]   Lot: [id number]   [place]: [id number]

## 2019-08-11 DIAGNOSIS — K4090 Unilateral inguinal hernia, without obstruction or gangrene, not specified as recurrent: Secondary | ICD-10-CM | POA: Diagnosis not present

## 2019-08-15 DIAGNOSIS — R69 Illness, unspecified: Secondary | ICD-10-CM | POA: Diagnosis not present

## 2019-08-25 DIAGNOSIS — M810 Age-related osteoporosis without current pathological fracture: Secondary | ICD-10-CM | POA: Diagnosis not present

## 2019-08-25 DIAGNOSIS — Z7952 Long term (current) use of systemic steroids: Secondary | ICD-10-CM | POA: Diagnosis not present

## 2019-08-25 DIAGNOSIS — E785 Hyperlipidemia, unspecified: Secondary | ICD-10-CM | POA: Diagnosis not present

## 2019-08-25 DIAGNOSIS — M353 Polymyalgia rheumatica: Secondary | ICD-10-CM | POA: Diagnosis not present

## 2019-08-25 DIAGNOSIS — M1990 Unspecified osteoarthritis, unspecified site: Secondary | ICD-10-CM | POA: Diagnosis not present

## 2019-09-05 ENCOUNTER — Ambulatory Visit: Payer: Self-pay | Admitting: Surgery

## 2019-09-05 DIAGNOSIS — K4090 Unilateral inguinal hernia, without obstruction or gangrene, not specified as recurrent: Secondary | ICD-10-CM | POA: Diagnosis not present

## 2019-09-05 NOTE — H&P (View-Only) (Signed)
Delmer Islam Documented: 09/05/2019 11:28 AM Location: [facility] Patient #: [id number] DOB: Oct 09, 1930 Married / Language: [name] / Race: White Male  History of Present Illness Marcello Moores A. Shantoria Ellwood MD; 09/05/2019 12:52 PM) Patient words: Patient presents with chief complaint of left groin bulge, pain and burning the last 1 month. He has a history of right inguinal hernia repair with mesh in 2017 with mesh. He developed more swelling, burning pain and a bulge in his left groin. This is made worse with physical activity. It is made better when he lays supine and applies ice. He has no nausea or vomiting.  The patient is a 86 year old male.   Allergies (Chanel Teressa Senter, CMA; 09/05/2019 11:28 AM) OxyCODONE HCl *ANALGESICS - OPIOID* Allergies Reconciled  Medication History (Chanel Teressa Senter, CMA; 09/05/2019 11:29 AM) Aspirin (81MG  Tablet, Oral daily) Active. Atorvastatin Calcium (40MG  Tablet, Oral daily) Active. amLODIPine Besylate (5MG  Tablet, Oral) Active. predniSONE (5MG  Tablet, Oral) Active. Medications Reconciled    Vitals (Chanel Nolan CMA; 09/05/2019 11:29 AM) 09/05/2019 11:29 AM Weight: 168.5 lb Height: 70in Body Surface Area: 1.94 m Body Mass Index: 24.18 kg/m  Temp.: 97.2F  Pulse: 86 (Regular)  BP: 130/72 (Sitting, Left Arm, Standard)        Physical Exam (Oluwatimilehin Balfour A. Rashidi Loh MD; 09/05/2019 12:53 PM)  General Mental Status-Alert. General Appearance-Consistent with stated age. Hydration-Well hydrated. Voice-Normal.  Chest and Lung Exam Note: Lung sounds clear. Work of breathing normal  Cardiovascular Note: Normal sinus rhythm no JVD  Abdomen Note: Soft nontender right groin scar well-healed. No evidence of hernia recurrence. Moderate size left inguinal hernia which is reducible  Neurologic Neurologic evaluation reveals -alert and oriented x 3 with no impairment of recent or remote memory. Mental  Status-Normal.    Assessment & Plan (Luberta Grabinski A. Jaxtyn Linville MD; 09/05/2019 12:53 PM)  INGUINAL HERNIA OF LEFT SIDE WITHOUT OBSTRUCTION OR GANGRENE (K40.90) Impression: The risk of hernia repair include bleeding, infection, organ injury, bowel injury, bladder injury, nerve injury recurrent hernia, blood clots, worsening of underlying condition, chronic pain, mesh use, open surgery, death, and the need for other operations. Pt agrees to proceed Discussed repair versus observation of his left inguinal hernia. Discussed techniques of repair laparoscopic, open, and mesh. He is opted for open repair of left inguinal hernia with mesh.   Total time 30 minutes discussing surgery, complications, options, face-to-face time, review of chart and documentation  Current Plans Pt Education - CCS General Post-op HCI Pt Education - CCS Mesh education: discussed with patient and provided information. Pt Education - Consent for inguinal hernia - Kinsinger: discussed with patient and provided information. The anatomy & physiology of the abdominal wall and pelvic floor was discussed. The pathophysiology of hernias in the inguinal and pelvic region was discussed. Natural history risks such as progressive enlargement, pain, incarceration, and strangulation was discussed. Contributors to complications such as smoking, obesity, diabetes, prior surgery, etc were discussed.  I feel the risks of no intervention will lead to serious problems that outweigh the operative risks; therefore, I recommended surgery to reduce and repair the hernia. I explained an open approach. I noted usual use of mesh to patch and/or buttress hernia repair  Risks such as bleeding, infection, abscess, need for further treatment, heart attack, death, and other risks were discussed. I noted a good likelihood this will help address the problem. Goals of post-operative recovery were discussed as well. Possibility that this will not correct all  symptoms was explained. I stressed the importance of low-impact  activity, aggressive pain control, avoiding constipation, & not pushing through pain to minimize risk of post-operative chronic pain or injury. Possibility of reherniation was discussed. We will work to minimize complications.  An educational handout further explaining the pathology & treatment options was given as well. Questions were answered. The patient expresses understanding & wishes to proceed with surgery.

## 2019-09-05 NOTE — H&P (Signed)
Jerry Booth Documented: 09/05/2019 11:28 AM Location: [facility] Patient #: [id number] DOB: 1931/01/07 Married / Language: Jerry Booth / Race: White Male  History of Present Illness Marcello Moores A. Vivienne Sangiovanni MD; 09/05/2019 12:52 PM) Patient words: Patient presents with chief complaint of left groin bulge, pain and burning the last 1 month. He has a history of right inguinal hernia repair with mesh in 2017 with mesh. He developed more swelling, burning pain and a bulge in his left groin. This is made worse with physical activity. It is made better when he lays supine and applies ice. He has no nausea or vomiting.  The patient is a 86 year old male.   Allergies (Chanel Teressa Senter, CMA; 09/05/2019 11:28 AM) OxyCODONE HCl *ANALGESICS - OPIOID* Allergies Reconciled  Medication History (Chanel Teressa Senter, CMA; 09/05/2019 11:29 AM) Aspirin (81MG  Tablet, Oral daily) Active. Atorvastatin Calcium (40MG  Tablet, Oral daily) Active. amLODIPine Besylate (5MG  Tablet, Oral) Active. predniSONE (5MG  Tablet, Oral) Active. Medications Reconciled    Vitals (Chanel Nolan CMA; 09/05/2019 11:29 AM) 09/05/2019 11:29 AM Weight: 168.5 lb Height: 70in Body Surface Area: 1.94 m Body Mass Index: 24.18 kg/m  Temp.: 97.81F  Pulse: 86 (Regular)  BP: 130/72 (Sitting, Left Arm, Standard)        Physical Exam (Ritesh Opara A. Parissa Chiao MD; 09/05/2019 12:53 PM)  General Mental Status-Alert. General Appearance-Consistent with stated age. Hydration-Well hydrated. Voice-Normal.  Chest and Lung Exam Note: Lung sounds clear. Work of breathing normal  Cardiovascular Note: Normal sinus rhythm no JVD  Abdomen Note: Soft nontender right groin scar well-healed. No evidence of hernia recurrence. Moderate size left inguinal hernia which is reducible  Neurologic Neurologic evaluation reveals -alert and oriented x 3 with no impairment of recent or remote memory. Mental  Status-Normal.    Assessment & Plan (Patrik Turnbaugh A. Kawhi Diebold MD; 09/05/2019 12:53 PM)  INGUINAL HERNIA OF LEFT SIDE WITHOUT OBSTRUCTION OR GANGRENE (K40.90) Impression: The risk of hernia repair include bleeding, infection, organ injury, bowel injury, bladder injury, nerve injury recurrent hernia, blood clots, worsening of underlying condition, chronic pain, mesh use, open surgery, death, and the need for other operations. Pt agrees to proceed Discussed repair versus observation of his left inguinal hernia. Discussed techniques of repair laparoscopic, open, and mesh. He is opted for open repair of left inguinal hernia with mesh.   Total time 30 minutes discussing surgery, complications, options, face-to-face time, review of chart and documentation  Current Plans Pt Education - CCS General Post-op HCI Pt Education - CCS Mesh education: discussed with patient and provided information. Pt Education - Consent for inguinal hernia - Kinsinger: discussed with patient and provided information. The anatomy & physiology of the abdominal wall and pelvic floor was discussed. The pathophysiology of hernias in the inguinal and pelvic region was discussed. Natural history risks such as progressive enlargement, pain, incarceration, and strangulation was discussed. Contributors to complications such as smoking, obesity, diabetes, prior surgery, etc were discussed.  I feel the risks of no intervention will lead to serious problems that outweigh the operative risks; therefore, I recommended surgery to reduce and repair the hernia. I explained an open approach. I noted usual use of mesh to patch and/or buttress hernia repair  Risks such as bleeding, infection, abscess, need for further treatment, heart attack, death, and other risks were discussed. I noted a good likelihood this will help address the problem. Goals of post-operative recovery were discussed as well. Possibility that this will not correct all  symptoms was explained. I stressed the importance of low-impact  activity, aggressive pain control, avoiding constipation, & not pushing through pain to minimize risk of post-operative chronic pain or injury. Possibility of reherniation was discussed. We will work to minimize complications.  An educational handout further explaining the pathology & treatment options was given as well. Questions were answered. The patient expresses understanding & wishes to proceed with surgery.

## 2019-09-08 ENCOUNTER — Encounter (HOSPITAL_COMMUNITY): Payer: Self-pay

## 2019-09-08 NOTE — Patient Instructions (Addendum)
DUE TO COVID-19 ONLY ONE VISITOR IS ALLOWED TO COME WITH YOU AND STAY IN THE WAITING ROOM ONLY DURING PRE OP AND PROCEDURE. THE ONE VISITOR MAY VISIT WITH YOU IN YOUR PRIVATE ROOM DURING VISITING HOURS ONLY!!   COVID SWAB TESTING MUST BE COMPLETED ON: Monday, September 12, 2019 at  11:20 AM [address], [place] [facility] (Must self quarantine after testing. Follow instructions on handout.)             Your procedure is scheduled on: Thursday, September 15, 2019   Report to [facility] Main  Entrance   Report to Short Stay at 5:30 AM   [facility])   Call this number if you have problems the morning of surgery [phone]   Do not eat food:After Midnight.   May have liquids until 4:30 AM day of surgery   CLEAR LIQUID DIET  Foods Allowed                                                                     Foods Excluded  Water, Black Coffee and tea, regular and decaf                             liquids that you cannot  Plain Jell-O in any flavor  (No red)                                           see through such as: Fruit ices (not with fruit pulp)                                     milk, soups, orange juice  Iced Popsicles (No red)                                    All solid food Carbonated beverages, regular and diet                                    Apple juices Sports drinks like Gatorade (No red) Lightly seasoned clear broth or consume(fat free) Sugar, honey syrup  Sample Menu Breakfast                                Lunch                                     Supper Cranberry juice                    Beef broth                            Chicken  broth Jell-O                                     Grape juice                           Apple juice Coffee or tea                        Jell-O                                      Popsicle                                                Coffee or tea                         Coffee or tea   Oral Hygiene is also important to reduce your risk of infection.                                    Remember - BRUSH YOUR TEETH THE MORNING OF SURGERY WITH YOUR REGULAR TOOTHPASTE   Do NOT smoke after Midnight   Take these medicines the morning of surgery with A SIP OF WATER: Amlodipine, Atorvastatin, Prednisone                               You may not have any metal on your body including jewelry, and body piercings             Do not wear lotions, powders, perfumes/cologne, or deodorant                          Men may shave face and neck.   Do not bring valuables to the hospital. [place].   Contacts, dentures or bridgework may not be worn into surgery.    Patients discharged the day of surgery will not be allowed to drive home   Special Instructions: Bring a copy of your healthcare power of attorney and living will documents         the day of surgery if you haven't scanned them in before.              Please read over the following fact sheets you were given: [facility] - Preparing for Surgery Before surgery, you can play an important role.  Because skin is not sterile, your skin needs to be as free of germs as possible.  You can reduce the number of germs on your skin by washing with CHG (chlorahexidine gluconate) soap before surgery.  CHG is an antiseptic cleaner which kills germs and bonds with the skin to continue killing germs even after washing. Please DO NOT use if you have an allergy to CHG or antibacterial soaps.  If your skin becomes reddened/irritated stop  using the CHG and inform your nurse when you arrive at Short Stay. Do not shave (including legs and underarms) for at least 48 hours prior to the first CHG shower.  You may shave your face/neck.  Please follow these instructions carefully:  1.  Shower with CHG Soap the night before surgery and the  morning of surgery.  2.  If you choose to wash  your hair, wash your hair first as usual with your normal  shampoo.  3.  After you shampoo, rinse your hair and body thoroughly to remove the shampoo.                             4.  Use CHG as you would any other liquid soap.  You can apply chg directly to the skin and wash.  Gently with a scrungie or clean washcloth.  5.  Apply the CHG Soap to your body ONLY FROM THE NECK DOWN.   Do   not use on face/ open                           Wound or open sores. Avoid contact with eyes, ears mouth and   genitals (private parts).                       Wash face,  Genitals (private parts) with your normal soap.             6.  Wash thoroughly, paying special attention to the area where your    surgery  will be performed.  7.  Thoroughly rinse your body with warm water from the neck down.  8.  DO NOT shower/wash with your normal soap after using and rinsing off the CHG Soap.                9.  Pat yourself dry with a clean towel.            10.  Wear clean pajamas.            11.  Place clean sheets on your bed the night of your first shower and do not  sleep with pets. Day of Surgery : Do not apply any lotions/deodorants the morning of surgery.  Please wear clean clothes to the hospital/surgery center.  FAILURE TO FOLLOW THESE INSTRUCTIONS MAY RESULT IN THE CANCELLATION OF YOUR SURGERY  PATIENT SIGNATURE_________________________________  NURSE SIGNATURE__________________________________  ________________________________________________________________________

## 2019-09-12 ENCOUNTER — Other Ambulatory Visit (HOSPITAL_COMMUNITY)
Admission: RE | Admit: 2019-09-12 | Discharge: 2019-09-12 | Disposition: A | Discharge status: Home / Self Care | Payer: Medicare HMO | Source: Ambulatory Visit | Attending: Surgery | Admitting: Surgery

## 2019-09-12 ENCOUNTER — Encounter (HOSPITAL_COMMUNITY): Payer: Self-pay

## 2019-09-12 ENCOUNTER — Encounter (HOSPITAL_COMMUNITY)
Admission: RE | Admit: 2019-09-12 | Discharge: 2019-09-12 | Disposition: A | Discharge status: Home / Self Care | Payer: Medicare HMO | Source: Ambulatory Visit | Attending: Surgery | Admitting: Surgery

## 2019-09-12 ENCOUNTER — Other Ambulatory Visit: Payer: Self-pay

## 2019-09-12 DIAGNOSIS — I493 Ventricular premature depolarization: Secondary | ICD-10-CM | POA: Insufficient documentation

## 2019-09-12 DIAGNOSIS — Z20822 Contact with and (suspected) exposure to covid-19: Secondary | ICD-10-CM | POA: Insufficient documentation

## 2019-09-12 DIAGNOSIS — I4510 Unspecified right bundle-branch block: Secondary | ICD-10-CM | POA: Diagnosis not present

## 2019-09-12 DIAGNOSIS — Z01818 Encounter for other preprocedural examination: Secondary | ICD-10-CM | POA: Insufficient documentation

## 2019-09-12 DIAGNOSIS — R9431 Abnormal electrocardiogram [ECG] [EKG]: Secondary | ICD-10-CM | POA: Diagnosis not present

## 2019-09-12 HISTORY — DX: Unspecified right bundle-branch block: I45.10

## 2019-09-12 HISTORY — DX: Polymyalgia rheumatica: M35.3

## 2019-09-12 LAB — CBC WITH DIFFERENTIAL/PLATELET
Abs Immature Granulocytes: 0.02 10*3/uL (ref 0.00–0.07)
Basophils Absolute: 0 10*3/uL (ref 0.0–0.1)
Basophils Relative: 1 %
Eosinophils Absolute: 0 10*3/uL (ref 0.0–0.5)
Eosinophils Relative: 0 %
HCT: 44.4 % (ref 39.0–52.0)
Hemoglobin: 14.9 g/dL (ref 13.0–17.0)
Immature Granulocytes: 0 %
Lymphocytes Relative: 15 %
Lymphs Abs: 0.8 10*3/uL (ref 0.7–4.0)
MCH: 33.6 pg (ref 26.0–34.0)
MCHC: 33.6 g/dL (ref 30.0–36.0)
MCV: 100 fL (ref 80.0–100.0)
Monocytes Absolute: 0.4 10*3/uL (ref 0.1–1.0)
Monocytes Relative: 7 %
Neutro Abs: 4.3 10*3/uL (ref 1.7–7.7)
Neutrophils Relative %: 77 %
Platelets: 231 10*3/uL (ref 150–400)
RBC: 4.44 MIL/uL (ref 4.22–5.81)
RDW: 14.6 % (ref 11.5–15.5)
WBC: 5.6 10*3/uL (ref 4.0–10.5)
nRBC: 0 % (ref 0.0–0.2)

## 2019-09-12 LAB — COMPREHENSIVE METABOLIC PANEL
ALT: 18 U/L (ref 0–44)
AST: 16 U/L (ref 15–41)
Albumin: 4.3 g/dL (ref 3.5–5.0)
Alkaline Phosphatase: 58 U/L (ref 38–126)
Anion gap: 11 (ref 5–15)
BUN: 21 mg/dL (ref 8–23)
CO2: 27 mmol/L (ref 22–32)
Calcium: 9.1 mg/dL (ref 8.9–10.3)
Chloride: 101 mmol/L (ref 98–111)
Creatinine, Ser: 0.79 mg/dL (ref 0.61–1.24)
GFR calc Af Amer: >60 mL/min (ref >60)
GFR calc non Af Amer: >60 mL/min (ref >60)
Glucose, Bld: 114 mg/dL — ABNORMAL HIGH (ref 70–99)
Potassium: 3.8 mmol/L (ref 3.5–5.1)
Sodium: 139 mmol/L (ref 135–145)
Total Bilirubin: 1 mg/dL (ref 0.3–1.2)
Total Protein: 7 g/dL (ref 6.5–8.1)

## 2019-09-12 LAB — SARS CORONAVIRUS 2 (TAT 6-24 HRS): SARS Coronavirus 2: NEGATIVE

## 2019-09-12 NOTE — Progress Notes (Signed)
PCP - Dr. Electa Sniff Cardiologist - N/A  Chest x-ray - N/A EKG - 09/12/19 in epic Stress Test - N/A ECHO - N/A Cardiac Cath - N/A  Sleep Study - N/A CPAP - N/A  Fasting Blood Sugar - N/A Checks Blood Sugar __N/A___ times a day  Blood Thinner Instructions: N/A  Aspirin Instructions:  Last Dose: 09/07/19  Anesthesia review: N/A  Patient denies shortness of breath, fever, cough and chest pain at PAT appointment   Patient verbalized understanding of instructions that were given to them at the PAT appointment. Patient was also instructed that they will need to review over the PAT instructions again at home before surgery.

## 2019-09-14 ENCOUNTER — Encounter (HOSPITAL_COMMUNITY): Payer: Self-pay | Admitting: Surgery

## 2019-09-14 NOTE — Anesthesia Preprocedure Evaluation (Addendum)
Anesthesia Evaluation  Patient identified by MRN, date of birth, ID band Patient awake    Reviewed: Allergy & Precautions, NPO status , Patient's Chart, lab work & pertinent test results  Airway Mallampati: III  TM Distance: >3 FB Neck ROM: Full    Dental no notable dental hx.    Pulmonary former smoker,    Pulmonary exam normal breath sounds clear to auscultation       Cardiovascular hypertension, Pt. on medications Normal cardiovascular exam Rhythm:Regular Rate:Normal  ECG: rate 81. Sinus rhythm with occasional Premature ventricular complexes Right bundle branch block   Neuro/Psych negative neurological ROS  negative psych ROS   GI/Hepatic negative GI ROS, Neg liver ROS,   Endo/Other  negative endocrine ROS  Renal/GU negative Renal ROS     Musculoskeletal Polymyalgia rheumatica   Abdominal   Peds  Hematology HLD   Anesthesia Other Findings LEFT INGUINAL HERNIA  Reproductive/Obstetrics                            Anesthesia Physical Anesthesia Plan  ASA: II  Anesthesia Plan: General and Regional   Post-op Pain Management: GA combined w/ Regional for post-op pain   Induction: Intravenous  PONV Risk Score and Plan: 3 and Ondansetron, Dexamethasone and Treatment may vary due to age or medical condition  Airway Management Planned: Oral ETT  Additional Equipment:   Intra-op Plan:   Post-operative Plan: Extubation in OR  Informed Consent: I have reviewed the patients History and Physical, chart, labs and discussed the procedure including the risks, benefits and alternatives for the proposed anesthesia with the patient or authorized representative who has indicated his/her understanding and acceptance.     Dental advisory given  Plan Discussed with: CRNA  Anesthesia Plan Comments:        Anesthesia Quick Evaluation

## 2019-09-15 ENCOUNTER — Other Ambulatory Visit: Payer: Self-pay

## 2019-09-15 ENCOUNTER — Ambulatory Visit (HOSPITAL_COMMUNITY): Payer: Medicare HMO | Admitting: Anesthesiology

## 2019-09-15 ENCOUNTER — Ambulatory Visit (HOSPITAL_COMMUNITY)
Admission: RE | Admit: 2019-09-15 | Discharge: 2019-09-15 | Disposition: A | Discharge status: Home / Self Care | Payer: Medicare HMO | Attending: Surgery | Admitting: Surgery

## 2019-09-15 ENCOUNTER — Encounter (HOSPITAL_COMMUNITY): Payer: Self-pay | Admitting: Surgery

## 2019-09-15 ENCOUNTER — Encounter (HOSPITAL_COMMUNITY)
Admission: RE | Disposition: A | Discharge status: Home / Self Care | Payer: Self-pay | Source: Home / Self Care | Attending: Surgery

## 2019-09-15 DIAGNOSIS — G8918 Other acute postprocedural pain: Secondary | ICD-10-CM | POA: Diagnosis not present

## 2019-09-15 DIAGNOSIS — Z885 Allergy status to narcotic agent status: Secondary | ICD-10-CM | POA: Insufficient documentation

## 2019-09-15 DIAGNOSIS — Z7982 Long term (current) use of aspirin: Secondary | ICD-10-CM | POA: Diagnosis not present

## 2019-09-15 DIAGNOSIS — Z79899 Other long term (current) drug therapy: Secondary | ICD-10-CM | POA: Insufficient documentation

## 2019-09-15 DIAGNOSIS — I10 Essential (primary) hypertension: Secondary | ICD-10-CM | POA: Diagnosis not present

## 2019-09-15 DIAGNOSIS — K4090 Unilateral inguinal hernia, without obstruction or gangrene, not specified as recurrent: Secondary | ICD-10-CM | POA: Insufficient documentation

## 2019-09-15 DIAGNOSIS — I4510 Unspecified right bundle-branch block: Secondary | ICD-10-CM | POA: Diagnosis not present

## 2019-09-15 HISTORY — PX: INGUINAL HERNIA REPAIR: SHX194

## 2019-09-15 SURGERY — REPAIR, HERNIA, INGUINAL, ADULT
Anesthesia: Regional | Site: Inguinal | Laterality: Left

## 2019-09-15 MED ORDER — GABAPENTIN 300 MG PO CAPS
ORAL_CAPSULE | ORAL | Status: AC
  Administered 2019-09-15: 300 mg via ORAL
  Filled 2019-09-15: qty 1

## 2019-09-15 MED ORDER — ROPIVACAINE HCL 5 MG/ML IJ SOLN
INTRAMUSCULAR | Status: DC | PRN
  Administered 2019-09-15: 30 mL via PERINEURAL

## 2019-09-15 MED ORDER — ONDANSETRON HCL 4 MG/2ML IJ SOLN
INTRAMUSCULAR | Status: DC | PRN
  Administered 2019-09-15: 4 mg via INTRAVENOUS

## 2019-09-15 MED ORDER — LACTATED RINGERS IV SOLN: INTRAVENOUS | Status: DC | PRN

## 2019-09-15 MED ORDER — FENTANYL CITRATE (PF) 100 MCG/2ML IJ SOLN
INTRAMUSCULAR | Status: AC
  Filled 2019-09-15: qty 2

## 2019-09-15 MED ORDER — PROPOFOL 10 MG/ML IV BOLUS
INTRAVENOUS | Status: AC
  Filled 2019-09-15: qty 40

## 2019-09-15 MED ORDER — 0.9 % SODIUM CHLORIDE (POUR BTL) OPTIME
TOPICAL | Status: DC | PRN
  Administered 2019-09-15: 1000 mL

## 2019-09-15 MED ORDER — DEXAMETHASONE SODIUM PHOSPHATE 10 MG/ML IJ SOLN
INTRAMUSCULAR | Status: DC | PRN
  Administered 2019-09-15: 10 mg via INTRAVENOUS

## 2019-09-15 MED ORDER — CHLORHEXIDINE GLUCONATE CLOTH 2 % EX PADS: 6.0000 | MEDICATED_PAD | Freq: Once | CUTANEOUS | Status: DC

## 2019-09-15 MED ORDER — HYDROCODONE-ACETAMINOPHEN 5-325 MG PO TABS: 1.0000 | ORAL_TABLET | Freq: Four times a day (QID) | ORAL | 0 refills | Status: DC | PRN

## 2019-09-15 MED ORDER — FENTANYL CITRATE (PF) 100 MCG/2ML IJ SOLN
INTRAMUSCULAR | Status: DC | PRN
  Administered 2019-09-15 (×2): 50 ug via INTRAVENOUS

## 2019-09-15 MED ORDER — LIDOCAINE 2% (20 MG/ML) 5 ML SYRINGE
INTRAMUSCULAR | Status: DC | PRN
  Administered 2019-09-15: 100 mg via INTRAVENOUS

## 2019-09-15 MED ORDER — BUPIVACAINE-EPINEPHRINE (PF) 0.25% -1:200000 IJ SOLN
INTRAMUSCULAR | Status: DC | PRN
  Administered 2019-09-15: 10 mL

## 2019-09-15 MED ORDER — ONDANSETRON HCL 4 MG/2ML IJ SOLN: 4.0000 mg | Freq: Once | INTRAMUSCULAR | Status: DC | PRN

## 2019-09-15 MED ORDER — GABAPENTIN 300 MG PO CAPS: 300.0000 mg | ORAL_CAPSULE | ORAL | Status: AC

## 2019-09-15 MED ORDER — PROPOFOL 10 MG/ML IV BOLUS
INTRAVENOUS | Status: DC | PRN
  Administered 2019-09-15: 150 mg via INTRAVENOUS

## 2019-09-15 MED ORDER — MIDAZOLAM HCL 5 MG/5ML IJ SOLN
INTRAMUSCULAR | Status: DC | PRN
  Administered 2019-09-15 (×2): 1 mg via INTRAVENOUS

## 2019-09-15 MED ORDER — CEFAZOLIN SODIUM-DEXTROSE 2-4 GM/100ML-% IV SOLN
INTRAVENOUS | Status: AC
  Filled 2019-09-15: qty 100

## 2019-09-15 MED ORDER — CEFAZOLIN SODIUM-DEXTROSE 2-4 GM/100ML-% IV SOLN
2.0000 g | INTRAVENOUS | Status: AC
  Administered 2019-09-15: 2 g via INTRAVENOUS

## 2019-09-15 MED ORDER — FENTANYL CITRATE (PF) 100 MCG/2ML IJ SOLN: 25.0000 ug | INTRAMUSCULAR | Status: DC | PRN

## 2019-09-15 MED ORDER — EPHEDRINE SULFATE-NACL 50-0.9 MG/10ML-% IV SOSY
PREFILLED_SYRINGE | INTRAVENOUS | Status: DC | PRN
  Administered 2019-09-15 (×6): 5 mg via INTRAVENOUS
  Administered 2019-09-15: 10 mg via INTRAVENOUS

## 2019-09-15 MED ORDER — LIDOCAINE HCL 2 % IJ SOLN
INTRAMUSCULAR | Status: AC
  Filled 2019-09-15: qty 20

## 2019-09-15 MED ORDER — MIDAZOLAM HCL 2 MG/2ML IJ SOLN
INTRAMUSCULAR | Status: AC
  Filled 2019-09-15: qty 2

## 2019-09-15 MED ORDER — CLONIDINE HCL (ANALGESIA) 100 MCG/ML EP SOLN
EPIDURAL | Status: DC | PRN
  Administered 2019-09-15: 100 ug

## 2019-09-15 MED ORDER — LACTATED RINGERS IV SOLN: INTRAVENOUS | Status: DC

## 2019-09-15 MED ORDER — BUPIVACAINE HCL (PF) 0.25 % IJ SOLN
INTRAMUSCULAR | Status: AC
  Filled 2019-09-15: qty 30

## 2019-09-15 MED ORDER — ACETAMINOPHEN 500 MG PO TABS
ORAL_TABLET | ORAL | Status: AC
  Administered 2019-09-15: 1000 mg via ORAL
  Filled 2019-09-15: qty 2

## 2019-09-15 MED ORDER — ACETAMINOPHEN 500 MG PO TABS: 1000.0000 mg | ORAL_TABLET | ORAL | Status: AC

## 2019-09-15 SURGICAL SUPPLY — 49 items
ADH SKN CLS APL DERMABOND .7 (GAUZE/BANDAGES/DRESSINGS) ×1
APL SKNCLS STERI-STRIP NONHPOA (GAUZE/BANDAGES/DRESSINGS)
BENZOIN TINCTURE PRP APPL 2/3 (GAUZE/BANDAGES/DRESSINGS) IMPLANT
BLADE HEX COATED 2.75 (ELECTRODE) ×2 IMPLANT
BLADE SURG 15 STRL LF DISP TIS (BLADE) ×1 IMPLANT
BLADE SURG 15 STRL SS (BLADE) ×2
COVER WAND RF STERILE (DRAPES) IMPLANT
DECANTER SPIKE VIAL GLASS SM (MISCELLANEOUS) ×2 IMPLANT
DERMABOND ADVANCED (GAUZE/BANDAGES/DRESSINGS) ×1
DERMABOND ADVANCED .7 DNX12 (GAUZE/BANDAGES/DRESSINGS) IMPLANT
DRAIN PENROSE 0.5X18 (DRAIN) ×2 IMPLANT
DRAPE LAPAROTOMY TRNSV 102X78 (DRAPES) ×2 IMPLANT
ELECT REM PT RETURN 15FT ADLT (MISCELLANEOUS) ×2 IMPLANT
GAUZE SPONGE 4X4 12PLY STRL (GAUZE/BANDAGES/DRESSINGS) IMPLANT
GLOVE BIOGEL PI IND STRL 7.0 (GLOVE) ×1 IMPLANT
GLOVE BIOGEL PI INDICATOR 7.0 (GLOVE) ×1
GLOVE INDICATOR 8.0 STRL GRN (GLOVE) ×4 IMPLANT
GLOVE SS BIOGEL STRL SZ 7.5 (GLOVE) ×1 IMPLANT
GLOVE SUPERSENSE BIOGEL SZ 7.5 (GLOVE) ×1
GOWN STRL REUS W/TWL LRG LVL3 (GOWN DISPOSABLE) ×2 IMPLANT
GOWN STRL REUS W/TWL XL LVL3 (GOWN DISPOSABLE) ×4 IMPLANT
KIT BASIN OR (CUSTOM PROCEDURE TRAY) ×2 IMPLANT
KIT TURNOVER KIT A (KITS) IMPLANT
MESH HERNIA SYS ULTRAPRO LRG (Mesh General) ×1 IMPLANT
NDL HYPO 25X1 1.5 SAFETY (NEEDLE) ×1 IMPLANT
NEEDLE HYPO 25X1 1.5 SAFETY (NEEDLE) ×2 IMPLANT
NS IRRIG 1000ML POUR BTL (IV SOLUTION) ×2 IMPLANT
PACK BASIC VI WITH GOWN DISP (CUSTOM PROCEDURE TRAY) ×2 IMPLANT
PENCIL SMOKE EVACUATOR (MISCELLANEOUS) IMPLANT
SPONGE LAP 18X18 RF (DISPOSABLE) ×2 IMPLANT
STRIP CLOSURE SKIN 1/2X4 (GAUZE/BANDAGES/DRESSINGS) IMPLANT
SUT MNCRL AB 4-0 PS2 18 (SUTURE) ×2 IMPLANT
SUT NOVA 0 T19/GS 22DT (SUTURE) IMPLANT
SUT NOVA NAB GS-21 0 18 T12 DT (SUTURE) ×4 IMPLANT
SUT SILK 2 0 SH (SUTURE) IMPLANT
SUT SILK 3 0 (SUTURE)
SUT SILK 3-0 18XBRD TIE 12 (SUTURE) IMPLANT
SUT VIC AB 0 CT1 36 (SUTURE) ×2 IMPLANT
SUT VIC AB 2-0 SH 27 (SUTURE) ×2
SUT VIC AB 2-0 SH 27X BRD (SUTURE) ×1 IMPLANT
SUT VIC AB 3-0 SH 18 (SUTURE) ×2 IMPLANT
SUT VIC AB 3-0 SH 27 (SUTURE) ×2
SUT VIC AB 3-0 SH 27XBRD (SUTURE) ×1 IMPLANT
SUT VICRYL 3 0 BR 18  UND (SUTURE) ×2
SUT VICRYL 3 0 BR 18 UND (SUTURE) ×1 IMPLANT
SYR BULB IRRIGATION 50ML (SYRINGE) ×2 IMPLANT
SYR CONTROL 10ML LL (SYRINGE) ×2 IMPLANT
TOWEL OR 17X26 10 PK STRL BLUE (TOWEL DISPOSABLE) ×2 IMPLANT
YANKAUER SUCT BULB TIP 10FT TU (MISCELLANEOUS) ×2 IMPLANT

## 2019-09-15 NOTE — Anesthesia Procedure Notes (Signed)
Procedure Name: LMA Insertion Date/Time: 09/15/2019 7:49 AM Performed by: Lavina Hamman, CRNA Pre-anesthesia Checklist: Patient identified, Emergency Drugs available, Suction available and Patient being monitored Patient Re-evaluated:Patient Re-evaluated prior to induction Oxygen Delivery Method: Circle System Utilized Preoxygenation: Pre-oxygenation with 100% oxygen Induction Type: IV induction Ventilation: Mask ventilation without difficulty LMA: LMA inserted LMA Size: 5.0 Number of attempts: 1 Airway Equipment and Method: Bite block Placement Confirmation: positive ETCO2 Tube secured with: Tape Dental Injury: Teeth and Oropharynx as per pre-operative assessment

## 2019-09-15 NOTE — Discharge Instructions (Signed)
[facility] _______[facility] ° °UMBILICAL OR INGUINAL HERNIA REPAIR: POST OP INSTRUCTIONS ° °Always review your discharge instruction sheet given to you by the facility where your surgery was performed. °IF YOU HAVE DISABILITY OR FAMILY LEAVE FORMS, YOU MUST BRING THEM TO THE OFFICE FOR PROCESSING.   °DO NOT GIVE THEM TO YOUR DOCTOR. ° °1. A  prescription for pain medication may be given to you upon discharge.  Take your pain medication as prescribed, if needed.  If narcotic pain medicine is not needed, then you may take acetaminophen (Tylenol) or ibuprofen (Advil) as needed. °2. Take your usually prescribed medications unless otherwise directed. °If you need a refill on your pain medication, please contact your pharmacy.  They will contact our office to request authorization. Prescriptions will not be filled after 5 pm or on week-ends. °3. You should follow a light diet the first 24 hours after arrival home, such as soup and crackers, etc.  Be sure to include lots of fluids daily.  Resume your normal diet the day after surgery. °4.Most patients will experience some swelling and bruising around the umbilicus or in the groin and scrotum.  Ice packs and reclining will help.  Swelling and bruising can take several days to resolve.  °6. It is common to experience some constipation if taking pain medication after surgery.  Increasing fluid intake and taking a stool softener (such as Colace) will usually help or prevent this problem from occurring.  A mild laxative (Milk of Magnesia or Miralax) should be taken according to package directions if there are no bowel movements after 48 hours. °7. Unless discharge instructions indicate otherwise, you may remove your bandages 24-48 hours after surgery, and you may shower at that time.  You may have steri-strips (small skin tapes) in place directly over the incision.  These strips should be left on the skin for 7-10 days.  If your surgeon used skin glue on the  incision, you may shower in 24 hours.  The glue will flake off over the next 2-3 weeks.  Any sutures or staples will be removed at the office during your follow-up visit. °8. ACTIVITIES:  You may resume regular (light) daily activities beginning the next day--such as daily self-care, walking, climbing stairs--gradually increasing activities as tolerated.  You may have sexual intercourse when it is comfortable.  Refrain from any heavy lifting or straining until approved by your doctor. ° °a.You may drive when you are no longer taking prescription pain medication, you can comfortably wear a seatbelt, and you can safely maneuver your car and apply brakes. °b.RETURN TO WORK:   °_____________________________________________ ° °9.You should see your doctor in the office for a follow-up appointment approximately 2-3 weeks after your surgery.  Make sure that you call for this appointment within a day or two after you arrive home to insure a convenient appointment time. °10.OTHER INSTRUCTIONS: _________________________ °   _____________________________________ ° °WHEN TO CALL YOUR DOCTOR: °1. Fever over 101.0 °2. Inability to urinate °3. Nausea and/or vomiting °4. Extreme swelling or bruising °5. Continued bleeding from incision. °6. Increased pain, redness, or drainage from the incision ° °The clinic staff is available to answer your questions during regular business hours.  Please don’t hesitate to call and ask to speak to one of the nurses for clinical concerns.  If you have a medical emergency, go to the nearest emergency room or call 911.  A surgeon from [facility] is always on call at the hospital ° ° °  [address], [address], [place], [place]  [address] ? ° [address] [address], [place], [place]   [address] °[phone] ? [phone] ? FAX [phone] °Web site: [email or web address] °

## 2019-09-15 NOTE — Transfer of Care (Signed)
Immediate Anesthesia Transfer of Care Note  Patient: Jerry Booth  Procedure(s) Performed: Procedure(s): LEFT INGUINAL HERNIA REPAIR WITH MESH (Left)  Patient Location: PACU  Anesthesia Type:General  Level of Consciousness:  sedated, patient cooperative and responds to stimulation  Airway & Oxygen Therapy:Patient Spontanous Breathing and Patient connected to face mask oxgen  Post-op Assessment:  Report given to PACU RN and Post -op Vital signs reviewed and stable  Post vital signs:  Reviewed and stable  Last Vitals:  Vitals:   09/15/19 0552 09/15/19 0850  BP: (!) 163/99 (!) 165/99  Pulse: 84 86  Resp: 18 15  Temp: 37.1 C   SpO2: [id number]    Complications: No apparent anesthesia complications

## 2019-09-15 NOTE — Interval H&P Note (Signed)
History and Physical Interval Note:  09/15/2019 7:25 AM  Jerry Booth  has presented today for surgery, with the diagnosis of LEFT INGUINAL HERNIA.  The various methods of treatment have been discussed with the patient and family. After consideration of risks, benefits and other options for treatment, the patient has consented to  Procedure(s): LEFT INGUINAL HERNIA REPAIR WITH MESH (Left) as a surgical intervention.  The patient's history has been reviewed, patient examined, no change in status, stable for surgery.  I have reviewed the patient's chart and labs.  Questions were answered to the patient's satisfaction.     [place]

## 2019-09-15 NOTE — Op Note (Signed)
Left inguinal hernia repair with mesh, Open, Procedure Note  Indications: The patient presented with a history of a left, reducible inguinal hernia.  This is cause pain and he desired repair.  We discussed open and laparoscopic techniques as well as the use of mesh.  Long-term expectations, chronic conditions of chronic pain, and complications were discussed with the patient with hernia repair.  Possible complications of cord injury, chronic numbness, chronic pain, testicle pain or loss, and injury to adjacent organs discussed.  Nonoperative management discussed.The risk of hernia repair include bleeding,  Infection,   Recurrence of the hernia,  Mesh use, chronic pain,  Organ injury,  Bowel injury,  Bladder injury,   nerve injury with numbness around the incision,  Death,  and worsening of preexisting  medical problems.  The alternatives to surgery have been discussed as well..  Long term expectations of both operative and non operative treatments have been discussed.   The patient agrees to proceed.  Pre-operative Diagnosis: left reducible inguinal hernia  Post-operative Diagnosis: same  Surgeon: Turner Daniels MD  Assistants: None  Anesthesia: General endotracheal anesthesia and local consisting of 0.5% Marcaine and TAP block per anesthesia  ASA Class: 2  Procedure Details  The patient was seen again in the Holding Room. The risks, benefits, complications, treatment options, and expected outcomes were discussed with the patient. The possibilities of reaction to medication, pulmonary aspiration, perforation of viscus, bleeding, recurrent infection, the need for additional procedures, and development of a complication requiring transfusion or further operation were discussed with the patient and/or family. There was concurrence with the proposed plan, and informed consent was obtained. The site of surgery was properly noted/marked. The patient was taken to the Operating Room, identified as DEKEN DORFMAN, and the procedure verified as hernia repair. A Time Out was held and the above information confirmed.  The patient was placed in the supine position and underwent induction of anesthesia, the lower abdomen and groin was prepped and draped in the standard fashion, and 0.5% Marcaine  was used to anesthetize the skin over the mid-portion of the inguinal canal. A transverse incision was made. Dissection was carried through the soft tissue to expose the inguinal canal and inguinal ligament along its lower edge. The external oblique fascia was split along the course of its fibers, exposing the inguinal canal. The cord and nerve were looped using a Penrose drain and reflected out of the field. The defect was exposed and a piece of prolene hernia system ultrapro mesh was and placed into the indirect defect after reducing the hernia sac off of the cord.  He also had a modest direct defect as well.. Interupted 1-0 novafil suture was then used  to repair the defect, with the suture being sewn from the pubic tubercle inferiorly and superiorly along the canal to a level just beyond the internal ring. The mesh was split to allow passage of the cord  into the canal without entrapment.  Upon inspection of the repair, the ilioinguinal nerve was tethered under the proximal portion of the mesh and thus was divided to prevent entrapment as it exited the muscle.  The contents were then returned to canal and the external oblique fashion was then closed in a continuous fashion using 3-0 Vicryl suture taking care not to cause entrapment. Scarpa's layer closed with 3 0 vicryl and 4 0 monocryl used to close the skin.  Dermabond used for dressing.  Instrument, sponge, and needle counts were correct prior to  closure and at the conclusion of the case.  Findings: Hernia as above  Estimated Blood Loss: Minimal         Drains: None         Total IV Fluids: Per anesthesia record         Specimens: None                Complications: None; patient tolerated the procedure well.         Disposition: PACU - hemodynamically stable.         Condition: stable

## 2019-09-15 NOTE — Anesthesia Procedure Notes (Signed)
Anesthesia Regional Block: TAP block   Pre-Anesthetic Checklist: ,, timeout performed, Correct Patient, Correct Site, Correct Laterality, Correct Procedure,, site marked, risks and benefits discussed, Surgical consent,  Pre-op evaluation,  At surgeon's request and post-op pain management  Laterality: Left  Prep: chloraprep       Needles:  Injection technique: Single-shot  Needle Type: Echogenic Stimulator Needle     Needle Length: 10cm  Needle Gauge: 21     Additional Needles:   Procedures:,,,, ultrasound used (permanent image in chart),,,,  Narrative:  Start time: 09/15/2019 6:50 AM End time: 09/15/2019 7:00 AM Injection made incrementally with aspirations every 5 mL.  Performed by: Personally  Anesthesiologist: Murvin Natal, MD  Additional Notes: Functioning IV was confirmed and monitors were applied.  A 161mm 21ga Pajunk echogenic stimulator needle was used. Sterile prep, hand hygiene and sterile gloves were used.  Negative aspiration and negative test dose prior to incremental administration of local anesthetic. The patient tolerated the procedure well.

## 2019-09-15 NOTE — Anesthesia Postprocedure Evaluation (Signed)
Anesthesia Post Note  Patient: Jerry Booth  Procedure(s) Performed: LEFT INGUINAL HERNIA REPAIR WITH MESH (Left Inguinal)     Patient location during evaluation: PACU Anesthesia Type: Regional and General Level of consciousness: awake Pain management: pain level controlled Vital Signs Assessment: post-procedure vital signs reviewed and stable Respiratory status: spontaneous breathing, nonlabored ventilation, respiratory function stable and patient connected to nasal cannula oxygen Cardiovascular status: blood pressure returned to baseline and stable Postop Assessment: no apparent nausea or vomiting Anesthetic complications: no    Last Vitals:  Vitals:   09/15/19 0945 09/15/19 1018  BP: (!) 155/93 (!) 153/96  Pulse:  77  Resp:  14  Temp: 36.6 C   SpO2: 91% 91%    Last Pain:  Vitals:   09/15/19 1018  TempSrc:   PainSc: 1                  Tessla Spurling P Anabelen Kaminsky

## 2019-09-16 ENCOUNTER — Encounter: Payer: Self-pay | Admitting: *Deleted

## 2019-10-06 DIAGNOSIS — R69 Illness, unspecified: Secondary | ICD-10-CM | POA: Diagnosis not present

## 2019-10-10 ENCOUNTER — Other Ambulatory Visit (HOSPITAL_COMMUNITY): Payer: Medicare HMO

## 2019-10-27 DIAGNOSIS — D485 Neoplasm of uncertain behavior of skin: Secondary | ICD-10-CM | POA: Diagnosis not present

## 2019-10-27 DIAGNOSIS — C44722 Squamous cell carcinoma of skin of right lower limb, including hip: Secondary | ICD-10-CM | POA: Diagnosis not present

## 2019-10-27 DIAGNOSIS — L570 Actinic keratosis: Secondary | ICD-10-CM | POA: Diagnosis not present

## 2019-10-27 DIAGNOSIS — Z85828 Personal history of other malignant neoplasm of skin: Secondary | ICD-10-CM | POA: Diagnosis not present

## 2019-12-07 DIAGNOSIS — M25561 Pain in right knee: Secondary | ICD-10-CM | POA: Diagnosis not present

## 2019-12-07 DIAGNOSIS — M1990 Unspecified osteoarthritis, unspecified site: Secondary | ICD-10-CM | POA: Diagnosis not present

## 2019-12-07 DIAGNOSIS — M545 Low back pain: Secondary | ICD-10-CM | POA: Diagnosis not present

## 2019-12-07 DIAGNOSIS — E785 Hyperlipidemia, unspecified: Secondary | ICD-10-CM | POA: Diagnosis not present

## 2019-12-07 DIAGNOSIS — M353 Polymyalgia rheumatica: Secondary | ICD-10-CM | POA: Diagnosis not present

## 2019-12-07 DIAGNOSIS — M810 Age-related osteoporosis without current pathological fracture: Secondary | ICD-10-CM | POA: Diagnosis not present

## 2019-12-07 DIAGNOSIS — M25559 Pain in unspecified hip: Secondary | ICD-10-CM | POA: Diagnosis not present

## 2019-12-07 DIAGNOSIS — Z7952 Long term (current) use of systemic steroids: Secondary | ICD-10-CM | POA: Diagnosis not present

## 2019-12-19 DIAGNOSIS — M25561 Pain in right knee: Secondary | ICD-10-CM | POA: Diagnosis not present

## 2019-12-19 DIAGNOSIS — M25562 Pain in left knee: Secondary | ICD-10-CM | POA: Diagnosis not present

## 2019-12-19 DIAGNOSIS — M160 Bilateral primary osteoarthritis of hip: Secondary | ICD-10-CM | POA: Diagnosis not present

## 2019-12-19 DIAGNOSIS — M545 Low back pain: Secondary | ICD-10-CM | POA: Diagnosis not present

## 2019-12-19 DIAGNOSIS — M1711 Unilateral primary osteoarthritis, right knee: Secondary | ICD-10-CM | POA: Diagnosis not present

## 2019-12-19 DIAGNOSIS — M353 Polymyalgia rheumatica: Secondary | ICD-10-CM | POA: Diagnosis not present

## 2019-12-19 DIAGNOSIS — M25559 Pain in unspecified hip: Secondary | ICD-10-CM | POA: Diagnosis not present

## 2019-12-28 DIAGNOSIS — L821 Other seborrheic keratosis: Secondary | ICD-10-CM | POA: Diagnosis not present

## 2019-12-28 DIAGNOSIS — L570 Actinic keratosis: Secondary | ICD-10-CM | POA: Diagnosis not present

## 2019-12-28 DIAGNOSIS — L820 Inflamed seborrheic keratosis: Secondary | ICD-10-CM | POA: Diagnosis not present

## 2019-12-28 DIAGNOSIS — D692 Other nonthrombocytopenic purpura: Secondary | ICD-10-CM | POA: Diagnosis not present

## 2019-12-28 DIAGNOSIS — B078 Other viral warts: Secondary | ICD-10-CM | POA: Diagnosis not present

## 2019-12-28 DIAGNOSIS — D485 Neoplasm of uncertain behavior of skin: Secondary | ICD-10-CM | POA: Diagnosis not present

## 2019-12-28 DIAGNOSIS — Z85828 Personal history of other malignant neoplasm of skin: Secondary | ICD-10-CM | POA: Diagnosis not present

## 2020-01-04 DIAGNOSIS — Z961 Presence of intraocular lens: Secondary | ICD-10-CM | POA: Diagnosis not present

## 2020-01-04 DIAGNOSIS — H52203 Unspecified astigmatism, bilateral: Secondary | ICD-10-CM | POA: Diagnosis not present

## 2020-01-04 DIAGNOSIS — H524 Presbyopia: Secondary | ICD-10-CM | POA: Diagnosis not present

## 2020-01-04 DIAGNOSIS — H5213 Myopia, bilateral: Secondary | ICD-10-CM | POA: Diagnosis not present

## 2020-01-05 DIAGNOSIS — I10 Essential (primary) hypertension: Secondary | ICD-10-CM | POA: Diagnosis not present

## 2020-01-05 DIAGNOSIS — I6529 Occlusion and stenosis of unspecified carotid artery: Secondary | ICD-10-CM | POA: Diagnosis not present

## 2020-01-05 DIAGNOSIS — M353 Polymyalgia rheumatica: Secondary | ICD-10-CM | POA: Diagnosis not present

## 2020-01-05 DIAGNOSIS — E7800 Pure hypercholesterolemia, unspecified: Secondary | ICD-10-CM | POA: Diagnosis not present

## 2020-01-05 DIAGNOSIS — R7301 Impaired fasting glucose: Secondary | ICD-10-CM | POA: Diagnosis not present

## 2020-01-05 DIAGNOSIS — Z1389 Encounter for screening for other disorder: Secondary | ICD-10-CM | POA: Diagnosis not present

## 2020-01-10 DIAGNOSIS — M25559 Pain in unspecified hip: Secondary | ICD-10-CM | POA: Diagnosis not present

## 2020-01-10 DIAGNOSIS — M810 Age-related osteoporosis without current pathological fracture: Secondary | ICD-10-CM | POA: Diagnosis not present

## 2020-01-10 DIAGNOSIS — E785 Hyperlipidemia, unspecified: Secondary | ICD-10-CM | POA: Diagnosis not present

## 2020-01-10 DIAGNOSIS — M353 Polymyalgia rheumatica: Secondary | ICD-10-CM | POA: Diagnosis not present

## 2020-01-10 DIAGNOSIS — M545 Low back pain: Secondary | ICD-10-CM | POA: Diagnosis not present

## 2020-01-10 DIAGNOSIS — Z7952 Long term (current) use of systemic steroids: Secondary | ICD-10-CM | POA: Diagnosis not present

## 2020-01-10 DIAGNOSIS — M25561 Pain in right knee: Secondary | ICD-10-CM | POA: Diagnosis not present

## 2020-01-10 DIAGNOSIS — M1990 Unspecified osteoarthritis, unspecified site: Secondary | ICD-10-CM | POA: Diagnosis not present

## 2020-01-24 DIAGNOSIS — E7800 Pure hypercholesterolemia, unspecified: Secondary | ICD-10-CM | POA: Diagnosis not present

## 2020-01-24 DIAGNOSIS — Z8546 Personal history of malignant neoplasm of prostate: Secondary | ICD-10-CM | POA: Diagnosis not present

## 2020-01-24 DIAGNOSIS — M1990 Unspecified osteoarthritis, unspecified site: Secondary | ICD-10-CM | POA: Diagnosis not present

## 2020-01-24 DIAGNOSIS — I10 Essential (primary) hypertension: Secondary | ICD-10-CM | POA: Diagnosis not present

## 2020-02-22 DIAGNOSIS — M25561 Pain in right knee: Secondary | ICD-10-CM | POA: Diagnosis not present

## 2020-02-22 DIAGNOSIS — M1990 Unspecified osteoarthritis, unspecified site: Secondary | ICD-10-CM | POA: Diagnosis not present

## 2020-02-22 DIAGNOSIS — M353 Polymyalgia rheumatica: Secondary | ICD-10-CM | POA: Diagnosis not present

## 2020-02-22 DIAGNOSIS — E785 Hyperlipidemia, unspecified: Secondary | ICD-10-CM | POA: Diagnosis not present

## 2020-02-22 DIAGNOSIS — Z7952 Long term (current) use of systemic steroids: Secondary | ICD-10-CM | POA: Diagnosis not present

## 2020-02-22 DIAGNOSIS — M810 Age-related osteoporosis without current pathological fracture: Secondary | ICD-10-CM | POA: Diagnosis not present

## 2020-02-22 DIAGNOSIS — M545 Low back pain: Secondary | ICD-10-CM | POA: Diagnosis not present

## 2020-02-22 DIAGNOSIS — M25559 Pain in unspecified hip: Secondary | ICD-10-CM | POA: Diagnosis not present

## 2020-02-22 DIAGNOSIS — M542 Cervicalgia: Secondary | ICD-10-CM | POA: Diagnosis not present

## 2020-04-09 DIAGNOSIS — R69 Illness, unspecified: Secondary | ICD-10-CM | POA: Diagnosis not present

## 2020-04-30 DIAGNOSIS — R69 Illness, unspecified: Secondary | ICD-10-CM | POA: Diagnosis not present

## 2020-05-09 DIAGNOSIS — D485 Neoplasm of uncertain behavior of skin: Secondary | ICD-10-CM | POA: Diagnosis not present

## 2020-05-09 DIAGNOSIS — L570 Actinic keratosis: Secondary | ICD-10-CM | POA: Diagnosis not present

## 2020-05-09 DIAGNOSIS — C44319 Basal cell carcinoma of skin of other parts of face: Secondary | ICD-10-CM | POA: Diagnosis not present

## 2020-05-09 DIAGNOSIS — Z85828 Personal history of other malignant neoplasm of skin: Secondary | ICD-10-CM | POA: Diagnosis not present

## 2020-05-09 DIAGNOSIS — L820 Inflamed seborrheic keratosis: Secondary | ICD-10-CM | POA: Diagnosis not present

## 2020-05-29 DIAGNOSIS — I10 Essential (primary) hypertension: Secondary | ICD-10-CM | POA: Diagnosis not present

## 2020-05-29 DIAGNOSIS — E7800 Pure hypercholesterolemia, unspecified: Secondary | ICD-10-CM | POA: Diagnosis not present

## 2020-05-29 DIAGNOSIS — M1990 Unspecified osteoarthritis, unspecified site: Secondary | ICD-10-CM | POA: Diagnosis not present

## 2020-05-29 DIAGNOSIS — Z8546 Personal history of malignant neoplasm of prostate: Secondary | ICD-10-CM | POA: Diagnosis not present

## 2020-06-04 DIAGNOSIS — C44319 Basal cell carcinoma of skin of other parts of face: Secondary | ICD-10-CM | POA: Diagnosis not present

## 2020-06-04 DIAGNOSIS — Z85828 Personal history of other malignant neoplasm of skin: Secondary | ICD-10-CM | POA: Diagnosis not present

## 2020-07-02 DIAGNOSIS — L570 Actinic keratosis: Secondary | ICD-10-CM | POA: Diagnosis not present

## 2020-07-02 DIAGNOSIS — Z85828 Personal history of other malignant neoplasm of skin: Secondary | ICD-10-CM | POA: Diagnosis not present

## 2020-07-02 DIAGNOSIS — D1801 Hemangioma of skin and subcutaneous tissue: Secondary | ICD-10-CM | POA: Diagnosis not present

## 2020-07-02 DIAGNOSIS — L812 Freckles: Secondary | ICD-10-CM | POA: Diagnosis not present

## 2020-07-02 DIAGNOSIS — L821 Other seborrheic keratosis: Secondary | ICD-10-CM | POA: Diagnosis not present

## 2020-07-09 DIAGNOSIS — R202 Paresthesia of skin: Secondary | ICD-10-CM | POA: Diagnosis not present

## 2020-07-09 DIAGNOSIS — I10 Essential (primary) hypertension: Secondary | ICD-10-CM | POA: Diagnosis not present

## 2020-07-09 DIAGNOSIS — M353 Polymyalgia rheumatica: Secondary | ICD-10-CM | POA: Diagnosis not present

## 2020-07-17 DIAGNOSIS — M542 Cervicalgia: Secondary | ICD-10-CM | POA: Diagnosis not present

## 2020-07-20 DIAGNOSIS — I10 Essential (primary) hypertension: Secondary | ICD-10-CM | POA: Diagnosis not present

## 2020-07-20 DIAGNOSIS — E7800 Pure hypercholesterolemia, unspecified: Secondary | ICD-10-CM | POA: Diagnosis not present

## 2020-07-20 DIAGNOSIS — M1990 Unspecified osteoarthritis, unspecified site: Secondary | ICD-10-CM | POA: Diagnosis not present

## 2020-07-20 DIAGNOSIS — Z8546 Personal history of malignant neoplasm of prostate: Secondary | ICD-10-CM | POA: Diagnosis not present

## 2020-07-24 DIAGNOSIS — M542 Cervicalgia: Secondary | ICD-10-CM | POA: Diagnosis not present

## 2020-07-30 DIAGNOSIS — M542 Cervicalgia: Secondary | ICD-10-CM | POA: Diagnosis not present

## 2020-07-31 DIAGNOSIS — M150 Primary generalized (osteo)arthritis: Secondary | ICD-10-CM | POA: Diagnosis not present

## 2020-07-31 DIAGNOSIS — Z6824 Body mass index (BMI) 24.0-24.9, adult: Secondary | ICD-10-CM | POA: Diagnosis not present

## 2020-07-31 DIAGNOSIS — M5030 Other cervical disc degeneration, unspecified cervical region: Secondary | ICD-10-CM | POA: Diagnosis not present

## 2020-07-31 DIAGNOSIS — M5136 Other intervertebral disc degeneration, lumbar region: Secondary | ICD-10-CM | POA: Diagnosis not present

## 2020-07-31 DIAGNOSIS — M353 Polymyalgia rheumatica: Secondary | ICD-10-CM | POA: Diagnosis not present

## 2020-07-31 DIAGNOSIS — Z7952 Long term (current) use of systemic steroids: Secondary | ICD-10-CM | POA: Diagnosis not present

## 2020-08-03 DIAGNOSIS — Z20822 Contact with and (suspected) exposure to covid-19: Secondary | ICD-10-CM | POA: Diagnosis not present

## 2020-09-06 ENCOUNTER — Encounter: Payer: Self-pay | Admitting: Neurology

## 2020-09-12 DIAGNOSIS — M353 Polymyalgia rheumatica: Secondary | ICD-10-CM | POA: Diagnosis not present

## 2020-09-12 DIAGNOSIS — M5030 Other cervical disc degeneration, unspecified cervical region: Secondary | ICD-10-CM | POA: Diagnosis not present

## 2020-09-12 DIAGNOSIS — Z6824 Body mass index (BMI) 24.0-24.9, adult: Secondary | ICD-10-CM | POA: Diagnosis not present

## 2020-09-12 DIAGNOSIS — M5136 Other intervertebral disc degeneration, lumbar region: Secondary | ICD-10-CM | POA: Diagnosis not present

## 2020-09-12 DIAGNOSIS — Z7952 Long term (current) use of systemic steroids: Secondary | ICD-10-CM | POA: Diagnosis not present

## 2020-09-12 DIAGNOSIS — M150 Primary generalized (osteo)arthritis: Secondary | ICD-10-CM | POA: Diagnosis not present

## 2020-09-26 ENCOUNTER — Other Ambulatory Visit: Payer: Self-pay

## 2020-09-26 ENCOUNTER — Ambulatory Visit (INDEPENDENT_AMBULATORY_CARE_PROVIDER_SITE_OTHER): Payer: Medicare HMO | Admitting: Neurology

## 2020-09-26 DIAGNOSIS — R202 Paresthesia of skin: Secondary | ICD-10-CM | POA: Diagnosis not present

## 2020-09-26 DIAGNOSIS — G5603 Carpal tunnel syndrome, bilateral upper limbs: Secondary | ICD-10-CM

## 2020-09-26 NOTE — Procedures (Signed)
[facility]  [address]  [place], [place] [address] Tel: [phone] Fax:  [phone] Test Date:  09/26/2020  Patient: Jerry Booth DOB: 06-24-1931 Physician: Narda Amber, DO  Sex: Male Height: 5\' 10"  Ref Phys: Delanna Ahmadi, M.D.  ID#: [id number]   Technician:    Patient Complaints: This is a 87 year old man referred for evaluation of bilateral hand numbness and tingling, worse on the left.  NCV & EMG Findings: Extensive electrodiagnostic testing of the left upper extremity and additional studies of the right shows:  1. Bilateral median sensory responses show latency (L6.5, R5.2 ms) and reduced amplitude (L9.1, R4.2 V).  Bilateral ulnar sensory responses are within normal limits. 2. Bilateral median motor responses show prolonged latency (L6.2, R5.3 ms).  Bilateral ulnar motor responses are within normal limits.   3. Chronic motor axonal loss changes are seen affecting bilateral abductor pollicis brevis muscles, without accompanied active denervation.    Impression: Bilateral median neuropathy at or distal to the wrist, consistent with a clinical diagnosis of carpal tunnel syndrome.  Overall, these findings are moderate-to-severe in degree electrically.   ___________________________ Narda Amber, DO    Nerve Conduction Studies Anti Sensory Summary Table   Stim Site NR Peak (ms) Norm Peak (ms) P-T Amp (V) Norm P-T Amp  Left Median Anti Sensory (2nd Digit)  32C  Wrist    6.5 <3.8 9.1 >10  Right Median Anti Sensory (2nd Digit)  32C  Wrist    5.2 <3.8 4.2 >10  Left Ulnar Anti Sensory (5th Digit)  32C  Wrist    3.2 <3.2 11.0 >5  Right Ulnar Anti Sensory (5th Digit)  32C  Wrist    2.8 <3.2 8.7 >5   Motor Summary Table   Stim Site NR Onset (ms) Norm Onset (ms) O-P Amp (mV) Norm O-P Amp Site1 Site2 Delta-0 (ms) Dist (cm) Vel (m/s) Norm Vel (m/s)  Left Median Motor (Abd Poll Brev)  32C  Wrist    6.2 <4.0 6.0 >5 Elbow Wrist 5.0 29.0 58 >50   Elbow    11.2  5.9         Right Median Motor (Abd Poll Brev)  32C  Wrist    5.3 <4.0 5.7 >5 Elbow Wrist 5.2 27.0 52 >50  Elbow    10.5  5.3         Left Ulnar Motor (Abd Dig Minimi)  32C  Wrist    2.7 <3.1 8.2 >7 B Elbow Wrist 3.8 23.0 61 >50  B Elbow    6.5  7.5  A Elbow B Elbow 1.5 10.0 67 >50  A Elbow    8.0  7.2         Right Ulnar Motor (Abd Dig Minimi)  32C  Wrist    2.5 <3.1 8.7 >7 B Elbow Wrist 3.5 23.0 66 >50  B Elbow    6.0  8.5  A Elbow B Elbow 1.7 10.0 59 >50  A Elbow    7.7  8.4          EMG   Side Muscle Ins Act Fibs Psw Fasc Number Recrt Dur Dur. Amp Amp. Poly Poly. Comment  Left 1stDorInt Nml Nml Nml Nml Nml Nml Nml Nml Nml Nml Nml Nml N/A  Left Abd Poll Brev Nml Nml Nml Nml 1- Rapid Many 1+ Many 1+ Many 1+ N/A  Left PronatorTeres Nml Nml Nml Nml Nml Nml Nml Nml Nml Nml Nml Nml N/A  Left Biceps Nml Nml  Nml Nml Nml Nml Nml Nml Nml Nml Nml Nml N/A  Left Triceps Nml Nml Nml Nml Nml Nml Nml Nml Nml Nml Nml Nml N/A  Left Deltoid Nml Nml Nml Nml Nml Nml Nml Nml Nml Nml Nml Nml N/A  Right 1stDorInt Nml Nml Nml Nml Nml Nml Nml Nml Nml Nml Nml Nml N/A  Right Abd Poll Brev Nml Nml Nml Nml 1- Rapid Many 1+ Many 1+ Many 1+ N/A  Right PronatorTeres Nml Nml Nml Nml Nml Nml Nml Nml Nml Nml Nml Nml N/A      Waveforms:

## 2020-10-31 ENCOUNTER — Encounter: Payer: Medicare HMO | Admitting: Neurology

## 2020-12-03 DIAGNOSIS — D692 Other nonthrombocytopenic purpura: Secondary | ICD-10-CM | POA: Diagnosis not present

## 2020-12-03 DIAGNOSIS — L820 Inflamed seborrheic keratosis: Secondary | ICD-10-CM | POA: Diagnosis not present

## 2020-12-03 DIAGNOSIS — L570 Actinic keratosis: Secondary | ICD-10-CM | POA: Diagnosis not present

## 2020-12-03 DIAGNOSIS — Z85828 Personal history of other malignant neoplasm of skin: Secondary | ICD-10-CM | POA: Diagnosis not present

## 2020-12-03 DIAGNOSIS — L821 Other seborrheic keratosis: Secondary | ICD-10-CM | POA: Diagnosis not present

## 2020-12-14 DIAGNOSIS — M5136 Other intervertebral disc degeneration, lumbar region: Secondary | ICD-10-CM | POA: Diagnosis not present

## 2020-12-14 DIAGNOSIS — Z7952 Long term (current) use of systemic steroids: Secondary | ICD-10-CM | POA: Diagnosis not present

## 2020-12-14 DIAGNOSIS — M7989 Other specified soft tissue disorders: Secondary | ICD-10-CM | POA: Diagnosis not present

## 2020-12-14 DIAGNOSIS — M353 Polymyalgia rheumatica: Secondary | ICD-10-CM | POA: Diagnosis not present

## 2020-12-14 DIAGNOSIS — M150 Primary generalized (osteo)arthritis: Secondary | ICD-10-CM | POA: Diagnosis not present

## 2020-12-14 DIAGNOSIS — M5030 Other cervical disc degeneration, unspecified cervical region: Secondary | ICD-10-CM | POA: Diagnosis not present

## 2020-12-14 DIAGNOSIS — Z6823 Body mass index (BMI) 23.0-23.9, adult: Secondary | ICD-10-CM | POA: Diagnosis not present

## 2021-01-07 DIAGNOSIS — Z961 Presence of intraocular lens: Secondary | ICD-10-CM | POA: Diagnosis not present

## 2021-01-07 DIAGNOSIS — H52203 Unspecified astigmatism, bilateral: Secondary | ICD-10-CM | POA: Diagnosis not present

## 2021-01-10 DIAGNOSIS — I10 Essential (primary) hypertension: Secondary | ICD-10-CM | POA: Diagnosis not present

## 2021-01-10 DIAGNOSIS — R7301 Impaired fasting glucose: Secondary | ICD-10-CM | POA: Diagnosis not present

## 2021-01-10 DIAGNOSIS — Z1389 Encounter for screening for other disorder: Secondary | ICD-10-CM | POA: Diagnosis not present

## 2021-01-10 DIAGNOSIS — E7800 Pure hypercholesterolemia, unspecified: Secondary | ICD-10-CM | POA: Diagnosis not present

## 2021-01-10 DIAGNOSIS — I6529 Occlusion and stenosis of unspecified carotid artery: Secondary | ICD-10-CM | POA: Diagnosis not present

## 2021-01-10 DIAGNOSIS — M353 Polymyalgia rheumatica: Secondary | ICD-10-CM | POA: Diagnosis not present

## 2021-01-10 DIAGNOSIS — Z0000 Encounter for general adult medical examination without abnormal findings: Secondary | ICD-10-CM | POA: Diagnosis not present

## 2021-01-11 DIAGNOSIS — Z7952 Long term (current) use of systemic steroids: Secondary | ICD-10-CM | POA: Diagnosis not present

## 2021-01-11 DIAGNOSIS — Z7982 Long term (current) use of aspirin: Secondary | ICD-10-CM | POA: Diagnosis not present

## 2021-01-11 DIAGNOSIS — Z87891 Personal history of nicotine dependence: Secondary | ICD-10-CM | POA: Diagnosis not present

## 2021-01-11 DIAGNOSIS — Z79899 Other long term (current) drug therapy: Secondary | ICD-10-CM | POA: Diagnosis not present

## 2021-01-11 DIAGNOSIS — Z008 Encounter for other general examination: Secondary | ICD-10-CM | POA: Diagnosis not present

## 2021-01-11 DIAGNOSIS — I10 Essential (primary) hypertension: Secondary | ICD-10-CM | POA: Diagnosis not present

## 2021-01-11 DIAGNOSIS — E785 Hyperlipidemia, unspecified: Secondary | ICD-10-CM | POA: Diagnosis not present

## 2021-01-11 DIAGNOSIS — M1990 Unspecified osteoarthritis, unspecified site: Secondary | ICD-10-CM | POA: Diagnosis not present

## 2021-01-11 DIAGNOSIS — Z85828 Personal history of other malignant neoplasm of skin: Secondary | ICD-10-CM | POA: Diagnosis not present

## 2021-01-11 DIAGNOSIS — M353 Polymyalgia rheumatica: Secondary | ICD-10-CM | POA: Diagnosis not present

## 2021-01-18 DIAGNOSIS — H6123 Impacted cerumen, bilateral: Secondary | ICD-10-CM | POA: Diagnosis not present

## 2021-02-11 DIAGNOSIS — E7800 Pure hypercholesterolemia, unspecified: Secondary | ICD-10-CM | POA: Diagnosis not present

## 2021-02-11 DIAGNOSIS — Z8546 Personal history of malignant neoplasm of prostate: Secondary | ICD-10-CM | POA: Diagnosis not present

## 2021-02-11 DIAGNOSIS — I10 Essential (primary) hypertension: Secondary | ICD-10-CM | POA: Diagnosis not present

## 2021-02-11 DIAGNOSIS — M1990 Unspecified osteoarthritis, unspecified site: Secondary | ICD-10-CM | POA: Diagnosis not present

## 2021-03-20 DIAGNOSIS — Z6823 Body mass index (BMI) 23.0-23.9, adult: Secondary | ICD-10-CM | POA: Diagnosis not present

## 2021-03-20 DIAGNOSIS — M353 Polymyalgia rheumatica: Secondary | ICD-10-CM | POA: Diagnosis not present

## 2021-03-20 DIAGNOSIS — M5136 Other intervertebral disc degeneration, lumbar region: Secondary | ICD-10-CM | POA: Diagnosis not present

## 2021-03-20 DIAGNOSIS — M5030 Other cervical disc degeneration, unspecified cervical region: Secondary | ICD-10-CM | POA: Diagnosis not present

## 2021-03-20 DIAGNOSIS — M150 Primary generalized (osteo)arthritis: Secondary | ICD-10-CM | POA: Diagnosis not present

## 2021-03-20 DIAGNOSIS — Z7952 Long term (current) use of systemic steroids: Secondary | ICD-10-CM | POA: Diagnosis not present

## 2021-04-12 DIAGNOSIS — I10 Essential (primary) hypertension: Secondary | ICD-10-CM | POA: Diagnosis not present

## 2021-04-12 DIAGNOSIS — E7800 Pure hypercholesterolemia, unspecified: Secondary | ICD-10-CM | POA: Diagnosis not present

## 2021-06-06 ENCOUNTER — Encounter: Payer: Self-pay | Admitting: Family Medicine

## 2021-06-06 ENCOUNTER — Ambulatory Visit: Payer: Self-pay

## 2021-06-06 ENCOUNTER — Ambulatory Visit: Payer: Medicare HMO | Admitting: Family Medicine

## 2021-06-06 VITALS — BP 140/82 | Ht 70.0 in | Wt 159.0 lb

## 2021-06-06 DIAGNOSIS — M25511 Pain in right shoulder: Secondary | ICD-10-CM

## 2021-06-06 DIAGNOSIS — M24111 Other articular cartilage disorders, right shoulder: Secondary | ICD-10-CM | POA: Insufficient documentation

## 2021-06-06 NOTE — Assessment & Plan Note (Signed)
Symptoms are more consistent with the labrum and joint with effusion appreciated on exam.  Has recently stopped his prednisone from his PMR. -Counseled on home exercise therapy and supportive care. -Counseled on Aleve. -Counseled on compression. -Could consider injection or further imaging.

## 2021-06-06 NOTE — Progress Notes (Signed)
Jerry Booth - 86 y.o. male MRN [id number]  Date of birth: 1931/06/07  SUBJECTIVE:  Including CC & ROS.  No chief complaint on file.   Jerry Booth is a 87 y.o. male that is presenting with acute right shoulder pain.  The pain is worse with playing golf when he was in external rotation abduction.  No specific injury.  Does have a history of shoulder surgery from years ago.  Has recently stopped taking his prednisone from his ongoing polymyalgia rheumatica.   Review of Systems See HPI   HISTORY: Past Medical, Surgical, Social, and Family History Reviewed & Updated per EMR.   Pertinent Historical Findings include:  Past Medical History:  Diagnosis Date   Carotid artery disease ([place])    Carotid Stenosis   Hypertension    Incomplete RBBB 2019   Noted on EKG   Polymyalgia rheumatica (HCC)    Prostate cancer ([place])    RBBB 09/12/2019   EKG    Past Surgical History:  Procedure Laterality Date   COLONOSCOPY     INGUINAL HERNIA REPAIR Right 07/15/2016   Procedure: RIGHT INGUINAL HERNIA REPAIR;  Surgeon: Jackolyn Confer, MD;  Location: [facility];  Service: General;  Laterality: Right;   INGUINAL HERNIA REPAIR Left 09/15/2019   Procedure: LEFT INGUINAL HERNIA REPAIR WITH MESH;  Surgeon: Erroll Luna, MD;  Location: [facility];  Service: General;  Laterality: Left;   INSERTION OF MESH Right 07/15/2016   Procedure: INSERTION OF MESH;  Surgeon: Jackolyn Confer, MD;  Location: [facility];  Service: General;  Laterality: Right;   PROSTATE SURGERY     seed implants      with prostate surgery '08   SHOULDER SURGERY Bilateral    [place]    History reviewed. No pertinent family history.  Social History   Socioeconomic History   Marital status: Married    Spouse name: Not on file   Number of children: Not on file   Years of education: Not on file   Highest education level: Not on file  Occupational History   Not on file  Tobacco Use   Smoking status: Former     Types: Cigarettes    Quit date: 56    Years since quitting: 68.9   Smokeless tobacco: Never  Vaping Use   Vaping Use: Never used  Substance and Sexual Activity   Alcohol use: Yes    Comment: couple drinks each day   Drug use: No   Sexual activity: Not on file  Other Topics Concern   Not on file  Social History Narrative   Not on file   Social Determinants of Health   Financial Resource Strain: Not on file  Food Insecurity: Not on file  Transportation Needs: Not on file  Physical Activity: Not on file  Stress: Not on file  Social Connections: Not on file  Intimate Partner Violence: Not on file     PHYSICAL EXAM:  VS: BP 140/82 (BP Location: Left Arm, Patient Position: Sitting)   Ht 5\' 10"  (1.778 m)   Wt 159 lb (72.1 kg)   BMI 22.81 kg/m  Physical Exam Gen: NAD, alert, cooperative with exam, well-appearing   Limited ultrasound: Right shoulder:  Degenerative changes of the biceps tendon with encircling effusion. Mild degenerative changes of subscapularis. Thickening of the supraspinatus with degenerative changes and mild hyperemia. 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  Has recently stopped his prednisone from his PMR. -Counseled on home exercise therapy and supportive care. -Counseled on Aleve. -Counseled on compression. -Could consider injection or further imaging.

## 2021-06-06 NOTE — Patient Instructions (Signed)
Nice to meet you Please try heat before exercise and ice after  Please use aleve as needed  Please try the exercises  You can consider the compression   Please send me a message in [place] with any questions or updates.  Please see me back in 4 weeks.   --Dr. Raeford Razor

## 2021-06-12 DIAGNOSIS — D692 Other nonthrombocytopenic purpura: Secondary | ICD-10-CM | POA: Diagnosis not present

## 2021-06-12 DIAGNOSIS — L821 Other seborrheic keratosis: Secondary | ICD-10-CM | POA: Diagnosis not present

## 2021-06-12 DIAGNOSIS — M6740 Ganglion, unspecified site: Secondary | ICD-10-CM | POA: Diagnosis not present

## 2021-06-12 DIAGNOSIS — L570 Actinic keratosis: Secondary | ICD-10-CM | POA: Diagnosis not present

## 2021-06-12 DIAGNOSIS — L812 Freckles: Secondary | ICD-10-CM | POA: Diagnosis not present

## 2021-06-12 DIAGNOSIS — Z85828 Personal history of other malignant neoplasm of skin: Secondary | ICD-10-CM | POA: Diagnosis not present

## 2021-06-12 DIAGNOSIS — L853 Xerosis cutis: Secondary | ICD-10-CM | POA: Diagnosis not present

## 2021-06-18 DIAGNOSIS — R7301 Impaired fasting glucose: Secondary | ICD-10-CM | POA: Diagnosis not present

## 2021-06-18 DIAGNOSIS — M152 Bouchard's nodes (with arthropathy): Secondary | ICD-10-CM | POA: Diagnosis not present

## 2021-06-18 DIAGNOSIS — M353 Polymyalgia rheumatica: Secondary | ICD-10-CM | POA: Diagnosis not present

## 2021-06-18 DIAGNOSIS — R42 Dizziness and giddiness: Secondary | ICD-10-CM | POA: Diagnosis not present

## 2021-06-18 DIAGNOSIS — R531 Weakness: Secondary | ICD-10-CM | POA: Diagnosis not present

## 2021-06-27 DIAGNOSIS — G5603 Carpal tunnel syndrome, bilateral upper limbs: Secondary | ICD-10-CM | POA: Diagnosis not present

## 2021-06-27 DIAGNOSIS — M25531 Pain in right wrist: Secondary | ICD-10-CM | POA: Diagnosis not present

## 2021-06-27 DIAGNOSIS — M65841 Other synovitis and tenosynovitis, right hand: Secondary | ICD-10-CM | POA: Diagnosis not present

## 2021-06-27 DIAGNOSIS — M19031 Primary osteoarthritis, right wrist: Secondary | ICD-10-CM | POA: Diagnosis not present

## 2021-07-08 ENCOUNTER — Ambulatory Visit: Payer: Medicare HMO | Admitting: Family Medicine

## 2021-07-08 ENCOUNTER — Encounter: Payer: Self-pay | Admitting: Family Medicine

## 2021-07-08 VITALS — BP 126/80 | Ht 70.0 in | Wt 159.0 lb

## 2021-07-08 DIAGNOSIS — M24111 Other articular cartilage disorders, right shoulder: Secondary | ICD-10-CM

## 2021-07-08 NOTE — Progress Notes (Signed)
°  Jerry Booth - 88 y.o. male MRN [id number]  Date of birth: 01/20/1931  SUBJECTIVE:  Including CC & ROS.  No chief complaint on file.   Jerry Booth is a 88 y.o. male that is following up for his right shoulder pain.  The pain is still occurring but it is manageable.    Review of Systems See HPI   HISTORY: Past Medical, Surgical, Social, and Family History Reviewed & Updated per EMR.   Pertinent Historical Findings include:  Past Medical History:  Diagnosis Date   Carotid artery disease ([place])    Carotid Stenosis   Hypertension    Incomplete RBBB 2019   Noted on EKG   Polymyalgia rheumatica (HCC)    Prostate cancer ([place])    RBBB 09/12/2019   EKG    Past Surgical History:  Procedure Laterality Date   COLONOSCOPY     INGUINAL HERNIA REPAIR Right 07/15/2016   Procedure: RIGHT INGUINAL HERNIA REPAIR;  Surgeon: Jackolyn Confer, MD;  Location: [facility];  Service: General;  Laterality: Right;   INGUINAL HERNIA REPAIR Left 09/15/2019   Procedure: LEFT INGUINAL HERNIA REPAIR WITH MESH;  Surgeon: Erroll Luna, MD;  Location: [facility];  Service: General;  Laterality: Left;   INSERTION OF MESH Right 07/15/2016   Procedure: INSERTION OF MESH;  Surgeon: Jackolyn Confer, MD;  Location: [facility];  Service: General;  Laterality: Right;   PROSTATE SURGERY     seed implants      with prostate surgery '08   SHOULDER SURGERY Bilateral    TONSILLECTOMY     VASECTOMY  1976     PHYSICAL EXAM:  VS: BP 126/80 (BP Location: Left Arm, Patient Position: Sitting)    Ht 5\' 10"  (1.778 m)    Wt 159 lb (72.1 kg)    BMI 22.81 kg/m  Physical Exam Gen: NAD, alert, cooperative with exam, well-appearing MSK: Neurovascularly intact       ASSESSMENT & PLAN:   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 -Counseled on home exercise therapy and supportive care. -Could consider shockwave therapy or injection.

## 2021-07-08 NOTE — Patient Instructions (Signed)
Good to see you Please try heat before exercise and ice after   Please send me a message in MyChart with any questions or updates.  Please see me back in 6-8 weeks.   --Dr. Raeford Razor

## 2021-07-08 NOTE — Assessment & Plan Note (Signed)
Pain is still ongoing but is manageable and mild. -Counseled on home exercise therapy and supportive care. -Could consider shockwave therapy or injection.

## 2021-07-16 DIAGNOSIS — G5603 Carpal tunnel syndrome, bilateral upper limbs: Secondary | ICD-10-CM | POA: Diagnosis not present

## 2021-07-16 DIAGNOSIS — I6529 Occlusion and stenosis of unspecified carotid artery: Secondary | ICD-10-CM | POA: Diagnosis not present

## 2021-07-16 DIAGNOSIS — R69 Illness, unspecified: Secondary | ICD-10-CM | POA: Diagnosis not present

## 2021-07-16 DIAGNOSIS — I10 Essential (primary) hypertension: Secondary | ICD-10-CM | POA: Diagnosis not present

## 2021-07-16 DIAGNOSIS — M353 Polymyalgia rheumatica: Secondary | ICD-10-CM | POA: Diagnosis not present

## 2021-07-19 DIAGNOSIS — G5601 Carpal tunnel syndrome, right upper limb: Secondary | ICD-10-CM | POA: Diagnosis not present

## 2021-07-22 DIAGNOSIS — M353 Polymyalgia rheumatica: Secondary | ICD-10-CM | POA: Diagnosis not present

## 2021-07-22 DIAGNOSIS — G5601 Carpal tunnel syndrome, right upper limb: Secondary | ICD-10-CM | POA: Diagnosis not present

## 2021-07-22 DIAGNOSIS — Z6823 Body mass index (BMI) 23.0-23.9, adult: Secondary | ICD-10-CM | POA: Diagnosis not present

## 2021-07-22 DIAGNOSIS — M5030 Other cervical disc degeneration, unspecified cervical region: Secondary | ICD-10-CM | POA: Diagnosis not present

## 2021-07-22 DIAGNOSIS — Z7952 Long term (current) use of systemic steroids: Secondary | ICD-10-CM | POA: Diagnosis not present

## 2021-07-22 DIAGNOSIS — M5136 Other intervertebral disc degeneration, lumbar region: Secondary | ICD-10-CM | POA: Diagnosis not present

## 2021-07-22 DIAGNOSIS — M150 Primary generalized (osteo)arthritis: Secondary | ICD-10-CM | POA: Diagnosis not present

## 2021-08-02 DIAGNOSIS — M79641 Pain in right hand: Secondary | ICD-10-CM | POA: Diagnosis not present

## 2021-09-02 ENCOUNTER — Ambulatory Visit: Payer: Medicare HMO | Admitting: Family Medicine

## 2021-11-18 ENCOUNTER — Other Ambulatory Visit: Payer: Self-pay | Admitting: Internal Medicine

## 2021-11-18 DIAGNOSIS — R42 Dizziness and giddiness: Secondary | ICD-10-CM | POA: Diagnosis not present

## 2021-11-18 DIAGNOSIS — I6522 Occlusion and stenosis of left carotid artery: Secondary | ICD-10-CM

## 2021-11-20 DIAGNOSIS — M1991 Primary osteoarthritis, unspecified site: Secondary | ICD-10-CM | POA: Diagnosis not present

## 2021-11-20 DIAGNOSIS — M353 Polymyalgia rheumatica: Secondary | ICD-10-CM | POA: Diagnosis not present

## 2021-11-20 DIAGNOSIS — Z6823 Body mass index (BMI) 23.0-23.9, adult: Secondary | ICD-10-CM | POA: Diagnosis not present

## 2021-11-20 DIAGNOSIS — Z7952 Long term (current) use of systemic steroids: Secondary | ICD-10-CM | POA: Diagnosis not present

## 2021-11-20 DIAGNOSIS — M5136 Other intervertebral disc degeneration, lumbar region: Secondary | ICD-10-CM | POA: Diagnosis not present

## 2021-11-20 DIAGNOSIS — G5601 Carpal tunnel syndrome, right upper limb: Secondary | ICD-10-CM | POA: Diagnosis not present

## 2021-11-20 DIAGNOSIS — M79674 Pain in right toe(s): Secondary | ICD-10-CM | POA: Diagnosis not present

## 2021-11-20 DIAGNOSIS — M5030 Other cervical disc degeneration, unspecified cervical region: Secondary | ICD-10-CM | POA: Diagnosis not present

## 2021-11-21 DIAGNOSIS — I471 Supraventricular tachycardia: Secondary | ICD-10-CM | POA: Diagnosis not present

## 2021-11-21 DIAGNOSIS — I472 Ventricular tachycardia, unspecified: Secondary | ICD-10-CM | POA: Diagnosis not present

## 2021-11-21 DIAGNOSIS — R42 Dizziness and giddiness: Secondary | ICD-10-CM | POA: Diagnosis not present

## 2021-11-27 ENCOUNTER — Other Ambulatory Visit: Payer: Medicare HMO

## 2021-11-29 ENCOUNTER — Other Ambulatory Visit: Payer: Medicare HMO

## 2021-12-04 ENCOUNTER — Other Ambulatory Visit: Payer: Medicare HMO

## 2021-12-06 ENCOUNTER — Ambulatory Visit
Admission: RE | Admit: 2021-12-06 | Discharge: 2021-12-06 | Disposition: A | Discharge status: Home / Self Care | Payer: Medicare HMO | Source: Ambulatory Visit | Attending: Internal Medicine | Admitting: Internal Medicine

## 2021-12-06 DIAGNOSIS — I6522 Occlusion and stenosis of left carotid artery: Secondary | ICD-10-CM

## 2021-12-06 DIAGNOSIS — I6523 Occlusion and stenosis of bilateral carotid arteries: Secondary | ICD-10-CM | POA: Diagnosis not present

## 2021-12-10 DIAGNOSIS — R42 Dizziness and giddiness: Secondary | ICD-10-CM | POA: Diagnosis not present

## 2021-12-18 DIAGNOSIS — D2261 Melanocytic nevi of right upper limb, including shoulder: Secondary | ICD-10-CM | POA: Diagnosis not present

## 2021-12-18 DIAGNOSIS — D225 Melanocytic nevi of trunk: Secondary | ICD-10-CM | POA: Diagnosis not present

## 2021-12-18 DIAGNOSIS — D485 Neoplasm of uncertain behavior of skin: Secondary | ICD-10-CM | POA: Diagnosis not present

## 2021-12-18 DIAGNOSIS — Z85828 Personal history of other malignant neoplasm of skin: Secondary | ICD-10-CM | POA: Diagnosis not present

## 2021-12-18 DIAGNOSIS — C44319 Basal cell carcinoma of skin of other parts of face: Secondary | ICD-10-CM | POA: Diagnosis not present

## 2021-12-18 DIAGNOSIS — L821 Other seborrheic keratosis: Secondary | ICD-10-CM | POA: Diagnosis not present

## 2021-12-18 DIAGNOSIS — D2271 Melanocytic nevi of right lower limb, including hip: Secondary | ICD-10-CM | POA: Diagnosis not present

## 2021-12-18 DIAGNOSIS — D2272 Melanocytic nevi of left lower limb, including hip: Secondary | ICD-10-CM | POA: Diagnosis not present

## 2021-12-18 DIAGNOSIS — D2262 Melanocytic nevi of left upper limb, including shoulder: Secondary | ICD-10-CM | POA: Diagnosis not present

## 2021-12-18 DIAGNOSIS — L570 Actinic keratosis: Secondary | ICD-10-CM | POA: Diagnosis not present

## 2021-12-19 DIAGNOSIS — I471 Supraventricular tachycardia: Secondary | ICD-10-CM | POA: Diagnosis not present

## 2021-12-19 DIAGNOSIS — I472 Ventricular tachycardia, unspecified: Secondary | ICD-10-CM | POA: Diagnosis not present

## 2021-12-19 DIAGNOSIS — R42 Dizziness and giddiness: Secondary | ICD-10-CM | POA: Diagnosis not present

## 2021-12-27 ENCOUNTER — Other Ambulatory Visit (HOSPITAL_COMMUNITY): Payer: Self-pay | Admitting: Internal Medicine

## 2021-12-27 DIAGNOSIS — I472 Ventricular tachycardia, unspecified: Secondary | ICD-10-CM

## 2022-01-08 DIAGNOSIS — C44319 Basal cell carcinoma of skin of other parts of face: Secondary | ICD-10-CM | POA: Diagnosis not present

## 2022-01-13 DIAGNOSIS — M353 Polymyalgia rheumatica: Secondary | ICD-10-CM | POA: Diagnosis not present

## 2022-01-13 DIAGNOSIS — I472 Ventricular tachycardia, unspecified: Secondary | ICD-10-CM | POA: Diagnosis not present

## 2022-01-13 DIAGNOSIS — E7800 Pure hypercholesterolemia, unspecified: Secondary | ICD-10-CM | POA: Diagnosis not present

## 2022-01-13 DIAGNOSIS — R7301 Impaired fasting glucose: Secondary | ICD-10-CM | POA: Diagnosis not present

## 2022-01-13 DIAGNOSIS — Z8546 Personal history of malignant neoplasm of prostate: Secondary | ICD-10-CM | POA: Diagnosis not present

## 2022-01-13 DIAGNOSIS — Z0000 Encounter for general adult medical examination without abnormal findings: Secondary | ICD-10-CM | POA: Diagnosis not present

## 2022-01-13 DIAGNOSIS — Z961 Presence of intraocular lens: Secondary | ICD-10-CM | POA: Diagnosis not present

## 2022-01-13 DIAGNOSIS — I6529 Occlusion and stenosis of unspecified carotid artery: Secondary | ICD-10-CM | POA: Diagnosis not present

## 2022-01-13 DIAGNOSIS — I10 Essential (primary) hypertension: Secondary | ICD-10-CM | POA: Diagnosis not present

## 2022-01-13 DIAGNOSIS — Z1331 Encounter for screening for depression: Secondary | ICD-10-CM | POA: Diagnosis not present

## 2022-01-13 DIAGNOSIS — H52203 Unspecified astigmatism, bilateral: Secondary | ICD-10-CM | POA: Diagnosis not present

## 2022-01-13 DIAGNOSIS — H02105 Unspecified ectropion of left lower eyelid: Secondary | ICD-10-CM | POA: Diagnosis not present

## 2022-01-15 ENCOUNTER — Ambulatory Visit (HOSPITAL_COMMUNITY): Payer: Medicare HMO | Attending: Internal Medicine

## 2022-01-15 DIAGNOSIS — I10 Essential (primary) hypertension: Secondary | ICD-10-CM | POA: Diagnosis not present

## 2022-01-15 DIAGNOSIS — I2510 Atherosclerotic heart disease of native coronary artery without angina pectoris: Secondary | ICD-10-CM | POA: Insufficient documentation

## 2022-01-15 DIAGNOSIS — I5030 Unspecified diastolic (congestive) heart failure: Secondary | ICD-10-CM

## 2022-01-15 DIAGNOSIS — I472 Ventricular tachycardia, unspecified: Secondary | ICD-10-CM | POA: Diagnosis not present

## 2022-01-15 DIAGNOSIS — I3481 Nonrheumatic mitral (valve) annulus calcification: Secondary | ICD-10-CM | POA: Insufficient documentation

## 2022-01-15 DIAGNOSIS — I4510 Unspecified right bundle-branch block: Secondary | ICD-10-CM | POA: Diagnosis not present

## 2022-01-15 LAB — ECHOCARDIOGRAM COMPLETE
Area-P 1/2: 2.62 cm2
S' Lateral: 3.1 cm

## 2022-02-24 DIAGNOSIS — M1990 Unspecified osteoarthritis, unspecified site: Secondary | ICD-10-CM | POA: Diagnosis not present

## 2022-02-24 DIAGNOSIS — Z7952 Long term (current) use of systemic steroids: Secondary | ICD-10-CM | POA: Diagnosis not present

## 2022-02-24 DIAGNOSIS — E785 Hyperlipidemia, unspecified: Secondary | ICD-10-CM | POA: Diagnosis not present

## 2022-02-24 DIAGNOSIS — Z87891 Personal history of nicotine dependence: Secondary | ICD-10-CM | POA: Diagnosis not present

## 2022-02-24 DIAGNOSIS — R69 Illness, unspecified: Secondary | ICD-10-CM | POA: Diagnosis not present

## 2022-02-24 DIAGNOSIS — M353 Polymyalgia rheumatica: Secondary | ICD-10-CM | POA: Diagnosis not present

## 2022-02-24 DIAGNOSIS — I10 Essential (primary) hypertension: Secondary | ICD-10-CM | POA: Diagnosis not present

## 2022-02-24 DIAGNOSIS — Z85828 Personal history of other malignant neoplasm of skin: Secondary | ICD-10-CM | POA: Diagnosis not present

## 2022-03-26 DIAGNOSIS — Z7952 Long term (current) use of systemic steroids: Secondary | ICD-10-CM | POA: Diagnosis not present

## 2022-03-26 DIAGNOSIS — Z6823 Body mass index (BMI) 23.0-23.9, adult: Secondary | ICD-10-CM | POA: Diagnosis not present

## 2022-03-26 DIAGNOSIS — M5030 Other cervical disc degeneration, unspecified cervical region: Secondary | ICD-10-CM | POA: Diagnosis not present

## 2022-03-26 DIAGNOSIS — M79674 Pain in right toe(s): Secondary | ICD-10-CM | POA: Diagnosis not present

## 2022-03-26 DIAGNOSIS — M353 Polymyalgia rheumatica: Secondary | ICD-10-CM | POA: Diagnosis not present

## 2022-03-26 DIAGNOSIS — G5601 Carpal tunnel syndrome, right upper limb: Secondary | ICD-10-CM | POA: Diagnosis not present

## 2022-03-26 DIAGNOSIS — M1991 Primary osteoarthritis, unspecified site: Secondary | ICD-10-CM | POA: Diagnosis not present

## 2022-03-26 DIAGNOSIS — M5136 Other intervertebral disc degeneration, lumbar region: Secondary | ICD-10-CM | POA: Diagnosis not present

## 2022-05-26 DIAGNOSIS — L570 Actinic keratosis: Secondary | ICD-10-CM | POA: Diagnosis not present

## 2022-05-26 DIAGNOSIS — L720 Epidermal cyst: Secondary | ICD-10-CM | POA: Diagnosis not present

## 2022-07-16 DIAGNOSIS — G5603 Carpal tunnel syndrome, bilateral upper limbs: Secondary | ICD-10-CM | POA: Diagnosis not present

## 2022-07-16 DIAGNOSIS — M353 Polymyalgia rheumatica: Secondary | ICD-10-CM | POA: Diagnosis not present

## 2022-07-16 DIAGNOSIS — I6522 Occlusion and stenosis of left carotid artery: Secondary | ICD-10-CM | POA: Diagnosis not present

## 2022-07-16 DIAGNOSIS — R7301 Impaired fasting glucose: Secondary | ICD-10-CM | POA: Diagnosis not present

## 2022-07-16 DIAGNOSIS — H6123 Impacted cerumen, bilateral: Secondary | ICD-10-CM | POA: Diagnosis not present

## 2022-07-16 DIAGNOSIS — I10 Essential (primary) hypertension: Secondary | ICD-10-CM | POA: Diagnosis not present

## 2022-07-21 DIAGNOSIS — H6123 Impacted cerumen, bilateral: Secondary | ICD-10-CM | POA: Diagnosis not present

## 2022-08-25 DIAGNOSIS — Z7952 Long term (current) use of systemic steroids: Secondary | ICD-10-CM | POA: Diagnosis not present

## 2022-08-25 DIAGNOSIS — M5030 Other cervical disc degeneration, unspecified cervical region: Secondary | ICD-10-CM | POA: Diagnosis not present

## 2022-08-25 DIAGNOSIS — M353 Polymyalgia rheumatica: Secondary | ICD-10-CM | POA: Diagnosis not present

## 2022-08-25 DIAGNOSIS — M79674 Pain in right toe(s): Secondary | ICD-10-CM | POA: Diagnosis not present

## 2022-08-25 DIAGNOSIS — Z6823 Body mass index (BMI) 23.0-23.9, adult: Secondary | ICD-10-CM | POA: Diagnosis not present

## 2022-08-25 DIAGNOSIS — G5601 Carpal tunnel syndrome, right upper limb: Secondary | ICD-10-CM | POA: Diagnosis not present

## 2022-08-25 DIAGNOSIS — M1991 Primary osteoarthritis, unspecified site: Secondary | ICD-10-CM | POA: Diagnosis not present

## 2022-08-25 DIAGNOSIS — M5136 Other intervertebral disc degeneration, lumbar region: Secondary | ICD-10-CM | POA: Diagnosis not present

## 2022-08-27 DIAGNOSIS — M67442 Ganglion, left hand: Secondary | ICD-10-CM | POA: Diagnosis not present

## 2022-08-27 DIAGNOSIS — G5602 Carpal tunnel syndrome, left upper limb: Secondary | ICD-10-CM | POA: Diagnosis not present

## 2022-08-27 DIAGNOSIS — Z4789 Encounter for other orthopedic aftercare: Secondary | ICD-10-CM | POA: Diagnosis not present

## 2022-09-02 DIAGNOSIS — G5602 Carpal tunnel syndrome, left upper limb: Secondary | ICD-10-CM | POA: Diagnosis not present

## 2022-09-03 DIAGNOSIS — M353 Polymyalgia rheumatica: Secondary | ICD-10-CM | POA: Diagnosis not present

## 2022-09-03 DIAGNOSIS — M150 Primary generalized (osteo)arthritis: Secondary | ICD-10-CM | POA: Diagnosis not present

## 2022-09-03 DIAGNOSIS — I10 Essential (primary) hypertension: Secondary | ICD-10-CM | POA: Diagnosis not present

## 2022-09-03 DIAGNOSIS — I739 Peripheral vascular disease, unspecified: Secondary | ICD-10-CM | POA: Diagnosis not present

## 2022-09-03 DIAGNOSIS — Z8546 Personal history of malignant neoplasm of prostate: Secondary | ICD-10-CM | POA: Diagnosis not present

## 2022-09-03 DIAGNOSIS — Z7952 Long term (current) use of systemic steroids: Secondary | ICD-10-CM | POA: Diagnosis not present

## 2022-09-03 DIAGNOSIS — E785 Hyperlipidemia, unspecified: Secondary | ICD-10-CM | POA: Diagnosis not present

## 2022-09-03 DIAGNOSIS — M545 Low back pain, unspecified: Secondary | ICD-10-CM | POA: Diagnosis not present

## 2022-09-03 DIAGNOSIS — I6522 Occlusion and stenosis of left carotid artery: Secondary | ICD-10-CM | POA: Diagnosis not present

## 2022-09-03 DIAGNOSIS — Z85828 Personal history of other malignant neoplasm of skin: Secondary | ICD-10-CM | POA: Diagnosis not present

## 2022-09-04 DIAGNOSIS — D485 Neoplasm of uncertain behavior of skin: Secondary | ICD-10-CM | POA: Diagnosis not present

## 2022-09-04 DIAGNOSIS — C44329 Squamous cell carcinoma of skin of other parts of face: Secondary | ICD-10-CM | POA: Diagnosis not present

## 2022-09-04 DIAGNOSIS — L570 Actinic keratosis: Secondary | ICD-10-CM | POA: Diagnosis not present

## 2022-09-15 DIAGNOSIS — M25632 Stiffness of left wrist, not elsewhere classified: Secondary | ICD-10-CM | POA: Diagnosis not present

## 2022-10-13 ENCOUNTER — Encounter: Payer: Self-pay | Admitting: *Deleted

## 2022-12-22 DIAGNOSIS — H52203 Unspecified astigmatism, bilateral: Secondary | ICD-10-CM | POA: Diagnosis not present

## 2022-12-29 DIAGNOSIS — Z7952 Long term (current) use of systemic steroids: Secondary | ICD-10-CM | POA: Diagnosis not present

## 2022-12-29 DIAGNOSIS — I10 Essential (primary) hypertension: Secondary | ICD-10-CM | POA: Diagnosis not present

## 2022-12-29 DIAGNOSIS — R42 Dizziness and giddiness: Secondary | ICD-10-CM | POA: Diagnosis not present

## 2022-12-29 DIAGNOSIS — I951 Orthostatic hypotension: Secondary | ICD-10-CM | POA: Diagnosis not present

## 2022-12-29 DIAGNOSIS — R2681 Unsteadiness on feet: Secondary | ICD-10-CM | POA: Diagnosis not present

## 2022-12-29 DIAGNOSIS — I739 Peripheral vascular disease, unspecified: Secondary | ICD-10-CM | POA: Diagnosis not present

## 2022-12-29 DIAGNOSIS — M353 Polymyalgia rheumatica: Secondary | ICD-10-CM | POA: Diagnosis not present

## 2022-12-30 ENCOUNTER — Other Ambulatory Visit: Payer: Self-pay | Admitting: Registered Nurse

## 2022-12-30 DIAGNOSIS — R42 Dizziness and giddiness: Secondary | ICD-10-CM

## 2022-12-30 DIAGNOSIS — I10 Essential (primary) hypertension: Secondary | ICD-10-CM

## 2022-12-30 DIAGNOSIS — R2681 Unsteadiness on feet: Secondary | ICD-10-CM

## 2023-01-14 DIAGNOSIS — L570 Actinic keratosis: Secondary | ICD-10-CM | POA: Diagnosis not present

## 2023-01-14 DIAGNOSIS — L309 Dermatitis, unspecified: Secondary | ICD-10-CM | POA: Diagnosis not present

## 2023-01-14 DIAGNOSIS — L812 Freckles: Secondary | ICD-10-CM | POA: Diagnosis not present

## 2023-01-14 DIAGNOSIS — Z85828 Personal history of other malignant neoplasm of skin: Secondary | ICD-10-CM | POA: Diagnosis not present

## 2023-01-14 DIAGNOSIS — L821 Other seborrheic keratosis: Secondary | ICD-10-CM | POA: Diagnosis not present

## 2023-01-14 DIAGNOSIS — D692 Other nonthrombocytopenic purpura: Secondary | ICD-10-CM | POA: Diagnosis not present

## 2023-01-22 ENCOUNTER — Other Ambulatory Visit: Payer: Self-pay | Admitting: Registered Nurse

## 2023-01-22 ENCOUNTER — Ambulatory Visit
Admission: RE | Admit: 2023-01-22 | Discharge: 2023-01-22 | Disposition: A | Discharge status: Home / Self Care | Payer: Medicare HMO | Source: Ambulatory Visit | Attending: Registered Nurse | Admitting: Registered Nurse

## 2023-01-22 DIAGNOSIS — I6522 Occlusion and stenosis of left carotid artery: Secondary | ICD-10-CM | POA: Diagnosis not present

## 2023-01-22 DIAGNOSIS — R2681 Unsteadiness on feet: Secondary | ICD-10-CM

## 2023-01-22 DIAGNOSIS — I10 Essential (primary) hypertension: Secondary | ICD-10-CM

## 2023-01-22 DIAGNOSIS — R42 Dizziness and giddiness: Secondary | ICD-10-CM

## 2023-01-22 DIAGNOSIS — I6623 Occlusion and stenosis of bilateral posterior cerebral arteries: Secondary | ICD-10-CM | POA: Diagnosis not present

## 2023-01-22 DIAGNOSIS — I6503 Occlusion and stenosis of bilateral vertebral arteries: Secondary | ICD-10-CM | POA: Diagnosis not present

## 2023-01-22 DIAGNOSIS — I6601 Occlusion and stenosis of right middle cerebral artery: Secondary | ICD-10-CM | POA: Diagnosis not present

## 2023-01-22 MED ORDER — IOPAMIDOL (ISOVUE-370) INJECTION 76%
75.0000 mL | Freq: Once | INTRAVENOUS | Status: AC | PRN
  Administered 2023-01-22: 75 mL via INTRAVENOUS

## 2023-01-29 DIAGNOSIS — E785 Hyperlipidemia, unspecified: Secondary | ICD-10-CM | POA: Diagnosis not present

## 2023-01-29 DIAGNOSIS — Z125 Encounter for screening for malignant neoplasm of prostate: Secondary | ICD-10-CM | POA: Diagnosis not present

## 2023-01-29 DIAGNOSIS — Z7952 Long term (current) use of systemic steroids: Secondary | ICD-10-CM | POA: Diagnosis not present

## 2023-01-29 DIAGNOSIS — I10 Essential (primary) hypertension: Secondary | ICD-10-CM | POA: Diagnosis not present

## 2023-02-03 ENCOUNTER — Telehealth (HOSPITAL_COMMUNITY): Payer: Self-pay

## 2023-02-03 ENCOUNTER — Other Ambulatory Visit (HOSPITAL_COMMUNITY): Payer: Self-pay | Admitting: Interventional Radiology

## 2023-02-03 DIAGNOSIS — I771 Stricture of artery: Secondary | ICD-10-CM

## 2023-02-03 NOTE — Telephone Encounter (Signed)
Called to schedule consult w/Dr. Estanislado Pandy, no answer, left vm. AB

## 2023-02-05 ENCOUNTER — Telehealth (HOSPITAL_COMMUNITY): Payer: Self-pay

## 2023-02-05 NOTE — Telephone Encounter (Signed)
Called pt's PCP to get a copy of most recent H&P. Left fax number for them to send over notes. AB

## 2023-02-06 DIAGNOSIS — Z0000 Encounter for general adult medical examination without abnormal findings: Secondary | ICD-10-CM | POA: Diagnosis not present

## 2023-02-06 DIAGNOSIS — E785 Hyperlipidemia, unspecified: Secondary | ICD-10-CM | POA: Diagnosis not present

## 2023-02-06 DIAGNOSIS — B9689 Other specified bacterial agents as the cause of diseases classified elsewhere: Secondary | ICD-10-CM | POA: Diagnosis not present

## 2023-02-06 DIAGNOSIS — J019 Acute sinusitis, unspecified: Secondary | ICD-10-CM | POA: Diagnosis not present

## 2023-02-06 DIAGNOSIS — Z8546 Personal history of malignant neoplasm of prostate: Secondary | ICD-10-CM | POA: Diagnosis not present

## 2023-02-06 DIAGNOSIS — I6522 Occlusion and stenosis of left carotid artery: Secondary | ICD-10-CM | POA: Diagnosis not present

## 2023-02-06 DIAGNOSIS — M150 Primary generalized (osteo)arthritis: Secondary | ICD-10-CM | POA: Diagnosis not present

## 2023-02-06 DIAGNOSIS — I10 Essential (primary) hypertension: Secondary | ICD-10-CM | POA: Diagnosis not present

## 2023-02-06 DIAGNOSIS — Z7952 Long term (current) use of systemic steroids: Secondary | ICD-10-CM | POA: Diagnosis not present

## 2023-02-06 DIAGNOSIS — R42 Dizziness and giddiness: Secondary | ICD-10-CM | POA: Diagnosis not present

## 2023-02-06 DIAGNOSIS — M353 Polymyalgia rheumatica: Secondary | ICD-10-CM | POA: Diagnosis not present

## 2023-02-06 DIAGNOSIS — R82998 Other abnormal findings in urine: Secondary | ICD-10-CM | POA: Diagnosis not present

## 2023-02-09 ENCOUNTER — Ambulatory Visit (HOSPITAL_COMMUNITY)
Admission: RE | Admit: 2023-02-09 | Discharge: 2023-02-09 | Disposition: A | Discharge status: Home / Self Care | Payer: Medicare HMO | Source: Ambulatory Visit | Attending: Interventional Radiology | Admitting: Interventional Radiology

## 2023-02-09 DIAGNOSIS — I771 Stricture of artery: Secondary | ICD-10-CM

## 2023-02-09 DIAGNOSIS — R42 Dizziness and giddiness: Secondary | ICD-10-CM | POA: Diagnosis not present

## 2023-02-10 HISTORY — PX: IR RADIOLOGIST EVAL & MGMT: IMG5224

## 2023-02-11 DIAGNOSIS — I10 Essential (primary) hypertension: Secondary | ICD-10-CM | POA: Diagnosis not present

## 2023-02-11 DIAGNOSIS — N529 Male erectile dysfunction, unspecified: Secondary | ICD-10-CM | POA: Diagnosis not present

## 2023-02-11 DIAGNOSIS — M1990 Unspecified osteoarthritis, unspecified site: Secondary | ICD-10-CM | POA: Diagnosis not present

## 2023-02-11 DIAGNOSIS — Z7982 Long term (current) use of aspirin: Secondary | ICD-10-CM | POA: Diagnosis not present

## 2023-02-11 DIAGNOSIS — H9191 Unspecified hearing loss, right ear: Secondary | ICD-10-CM | POA: Diagnosis not present

## 2023-02-11 DIAGNOSIS — D8481 Immunodeficiency due to conditions classified elsewhere: Secondary | ICD-10-CM | POA: Diagnosis not present

## 2023-02-11 DIAGNOSIS — E785 Hyperlipidemia, unspecified: Secondary | ICD-10-CM | POA: Diagnosis not present

## 2023-02-11 DIAGNOSIS — Z791 Long term (current) use of non-steroidal anti-inflammatories (NSAID): Secondary | ICD-10-CM | POA: Diagnosis not present

## 2023-02-11 DIAGNOSIS — Z87891 Personal history of nicotine dependence: Secondary | ICD-10-CM | POA: Diagnosis not present

## 2023-02-11 DIAGNOSIS — M353 Polymyalgia rheumatica: Secondary | ICD-10-CM | POA: Diagnosis not present

## 2023-02-13 DIAGNOSIS — R42 Dizziness and giddiness: Secondary | ICD-10-CM | POA: Diagnosis not present

## 2023-02-20 DIAGNOSIS — I609 Nontraumatic subarachnoid hemorrhage, unspecified: Secondary | ICD-10-CM | POA: Diagnosis not present

## 2023-02-26 DIAGNOSIS — J310 Chronic rhinitis: Secondary | ICD-10-CM | POA: Diagnosis not present

## 2023-02-26 DIAGNOSIS — R42 Dizziness and giddiness: Secondary | ICD-10-CM | POA: Diagnosis not present

## 2023-02-26 DIAGNOSIS — M150 Primary generalized (osteo)arthritis: Secondary | ICD-10-CM | POA: Diagnosis not present

## 2023-02-26 DIAGNOSIS — Z7952 Long term (current) use of systemic steroids: Secondary | ICD-10-CM | POA: Diagnosis not present

## 2023-02-26 DIAGNOSIS — I10 Essential (primary) hypertension: Secondary | ICD-10-CM | POA: Diagnosis not present

## 2023-02-26 DIAGNOSIS — M353 Polymyalgia rheumatica: Secondary | ICD-10-CM | POA: Diagnosis not present

## 2023-02-26 DIAGNOSIS — M8580 Other specified disorders of bone density and structure, unspecified site: Secondary | ICD-10-CM | POA: Diagnosis not present

## 2023-02-26 DIAGNOSIS — I739 Peripheral vascular disease, unspecified: Secondary | ICD-10-CM | POA: Diagnosis not present

## 2023-02-26 DIAGNOSIS — E785 Hyperlipidemia, unspecified: Secondary | ICD-10-CM | POA: Diagnosis not present

## 2023-02-26 DIAGNOSIS — I6522 Occlusion and stenosis of left carotid artery: Secondary | ICD-10-CM | POA: Diagnosis not present

## 2023-03-27 DIAGNOSIS — Z6823 Body mass index (BMI) 23.0-23.9, adult: Secondary | ICD-10-CM | POA: Diagnosis not present

## 2023-03-27 DIAGNOSIS — M5030 Other cervical disc degeneration, unspecified cervical region: Secondary | ICD-10-CM | POA: Diagnosis not present

## 2023-03-27 DIAGNOSIS — G5601 Carpal tunnel syndrome, right upper limb: Secondary | ICD-10-CM | POA: Diagnosis not present

## 2023-03-27 DIAGNOSIS — M353 Polymyalgia rheumatica: Secondary | ICD-10-CM | POA: Diagnosis not present

## 2023-03-27 DIAGNOSIS — M5136 Other intervertebral disc degeneration, lumbar region: Secondary | ICD-10-CM | POA: Diagnosis not present

## 2023-03-27 DIAGNOSIS — M1991 Primary osteoarthritis, unspecified site: Secondary | ICD-10-CM | POA: Diagnosis not present

## 2023-03-27 DIAGNOSIS — Z7952 Long term (current) use of systemic steroids: Secondary | ICD-10-CM | POA: Diagnosis not present

## 2023-03-27 DIAGNOSIS — M79674 Pain in right toe(s): Secondary | ICD-10-CM | POA: Diagnosis not present

## 2023-04-07 DIAGNOSIS — R600 Localized edema: Secondary | ICD-10-CM | POA: Diagnosis not present

## 2023-04-07 DIAGNOSIS — J330 Polyp of nasal cavity: Secondary | ICD-10-CM | POA: Diagnosis not present

## 2023-04-07 DIAGNOSIS — J339 Nasal polyp, unspecified: Secondary | ICD-10-CM | POA: Diagnosis not present

## 2023-04-17 DIAGNOSIS — G939 Disorder of brain, unspecified: Secondary | ICD-10-CM | POA: Diagnosis not present

## 2023-04-17 DIAGNOSIS — D369 Benign neoplasm, unspecified site: Secondary | ICD-10-CM | POA: Diagnosis not present

## 2023-04-30 DIAGNOSIS — L570 Actinic keratosis: Secondary | ICD-10-CM | POA: Diagnosis not present

## 2023-05-01 ENCOUNTER — Ambulatory Visit (INDEPENDENT_AMBULATORY_CARE_PROVIDER_SITE_OTHER): Payer: Medicare HMO

## 2023-05-01 ENCOUNTER — Ambulatory Visit: Payer: Medicare HMO | Attending: Internal Medicine | Admitting: Internal Medicine

## 2023-05-01 VITALS — BP 130/52 | HR 89 | Ht 69.0 in | Wt 162.0 lb

## 2023-05-01 DIAGNOSIS — R42 Dizziness and giddiness: Secondary | ICD-10-CM

## 2023-05-01 DIAGNOSIS — I493 Ventricular premature depolarization: Secondary | ICD-10-CM | POA: Diagnosis not present

## 2023-05-01 NOTE — Patient Instructions (Signed)
Medication Instructions:  No change  *If you need a refill on your cardiac medications before your next appointment, please call your pharmacy*    Testing/Procedures: Your physician has requested that you have an exercise stress myoview. For further information please visit [email or web address] Please follow instruction sheet, as given.   The test will take approximately 3 to 4 hours to complete; you may bring reading material.  If someone comes with you to your appointment, they will need to remain in the main lobby due to limited space in the testing area. **If you are pregnant or breastfeeding, please notify the nuclear lab prior to your appointment**  How to prepare for your Myocardial Perfusion Test: Do not eat or drink 3 hours prior to your test, except you may have water. Do not consume products containing caffeine (regular or decaffeinated) 12 hours prior to your test. (ex: coffee, chocolate, sodas, tea). Do bring a list of your current medications with you.  If not listed below, you may take your medications as normal. Do wear comfortable clothes (no dresses or overalls) and walking shoes, tennis shoes preferred (No heels or open toe shoes are allowed). Do NOT wear cologne, perfume, aftershave, or lotions (deodorant is allowed). If these instructions are not followed your test will have to be rescheduled.    ZIO XT- Long Term Monitor Instructions  Your physician has requested you wear a ZIO patch monitor for 3 days.  This is a single patch monitor. [employer] supplies one patch monitor per enrollment. Additional stickers are not available. Please do not apply patch if you will be having a Nuclear Stress Test,  Echocardiogram, Cardiac CT, MRI, or Chest Xray during the period you would be wearing the  monitor. The patch cannot be worn during these tests. You cannot remove and re-apply the  ZIO XT patch monitor.  Your ZIO patch monitor will be mailed 3 day USPS to your address on file.  It may take 3-5 days  to receive your monitor after you have been enrolled.  Once you have received your monitor, please review the enclosed instructions. Your monitor  has already been registered assigning a specific monitor serial # to you.  Billing and Patient Assistance Program Information  We have supplied [employer] with any of your insurance information on file for billing purposes. [employer] offers a sliding scale Patient Assistance Program for patients that do not have  insurance, or whose insurance does not completely cover the cost of the ZIO monitor.  You must apply for the Patient Assistance Program to qualify for this discounted rate.  To apply, please call [employer] at [phone], select option 4, select option 2, ask to apply for  Patient Assistance Program. Meredeth Ide will ask your household income, and how many people  are in your household. They will quote your out-of-pocket cost based on that information.  [employer] will also be able to set up a 75-month, interest-free payment plan if needed.  Applying the monitor   Shave hair from upper left chest.  Hold abrader disc by orange tab. Rub abrader in 40 strokes over the upper left chest as  indicated in your monitor instructions.  Clean area with 4 enclosed alcohol pads. Let dry.  Apply patch as indicated in monitor instructions. Patch will be placed under collarbone on left  side of chest with arrow pointing upward.  Rub patch adhesive wings for 2 minutes. Remove white label marked "1". Remove the white  label marked "2". Rub patch adhesive wings for 2  additional minutes.  While looking in a mirror, press and release button in center of patch. A small green light will  flash 3-4 times. This will be your only indicator that the monitor has been turned on.  Do not shower for the first 24 hours. You may shower after the first 24 hours.  Press the button if you feel a symptom. You will hear a small click. Record Date, Time and   Symptom in the Patient Logbook.  When you are ready to remove the patch, follow instructions on the last 2 pages of Patient  Logbook. Stick patch monitor onto the last page of Patient Logbook.  Place Patient Logbook in the blue and white box. Use locking tab on box and tape box closed  securely. The blue and white box has prepaid postage on it. Please place it in the mailbox as  soon as possible. Your physician should have your test results approximately 7 days after the  monitor has been mailed back to [facility].  Call [facility] Customer Care at [phone] if you have questions regarding  your ZIO XT patch monitor. Call them immediately if you see an orange light blinking on your  monitor.  If your monitor falls off in less than 4 days, contact our Monitor department at [phone].  If your monitor becomes loose or falls off after 4 days call [employer] at [phone] for  suggestions on securing your monitor   Follow-Up: At [facility], you and your health needs are our priority.  As part of our continuing mission to provide you with exceptional heart care, we have created designated Provider Care Teams.  These Care Teams include your primary Cardiologist (physician) and Advanced Practice Providers (APPs -  Physician Assistants and Nurse Practitioners) who all work together to provide you with the care you need, when you need it.  We recommend signing up for the patient portal called "MyChart".  Sign up information is provided on this After Visit Summary.  MyChart is used to connect with patients for Virtual Visits (Telemedicine).  Patients are able to view lab/test results, encounter notes, upcoming appointments, etc.  Non-urgent messages can be sent to your provider as well.   To learn more about what you can do with MyChart, go to [email or web address].    Your next appointment:   3 month(s)  Provider:   Dr.Mary Wyline Mood

## 2023-05-01 NOTE — Progress Notes (Unsigned)
Enrolled patient for a 3 day Zio XT monitor to be mailed to patients home  

## 2023-05-05 DIAGNOSIS — I493 Ventricular premature depolarization: Secondary | ICD-10-CM | POA: Diagnosis not present

## 2023-05-06 ENCOUNTER — Encounter: Payer: Self-pay | Admitting: Internal Medicine

## 2023-05-13 ENCOUNTER — Telehealth (HOSPITAL_COMMUNITY): Payer: Self-pay | Admitting: *Deleted

## 2023-05-13 ENCOUNTER — Ambulatory Visit (HOSPITAL_COMMUNITY): Payer: Medicare HMO

## 2023-05-13 DIAGNOSIS — I10 Essential (primary) hypertension: Secondary | ICD-10-CM | POA: Diagnosis not present

## 2023-05-13 DIAGNOSIS — R42 Dizziness and giddiness: Secondary | ICD-10-CM | POA: Diagnosis not present

## 2023-05-13 DIAGNOSIS — M8580 Other specified disorders of bone density and structure, unspecified site: Secondary | ICD-10-CM | POA: Diagnosis not present

## 2023-05-13 DIAGNOSIS — M150 Primary generalized (osteo)arthritis: Secondary | ICD-10-CM | POA: Diagnosis not present

## 2023-05-13 DIAGNOSIS — Z7952 Long term (current) use of systemic steroids: Secondary | ICD-10-CM | POA: Diagnosis not present

## 2023-05-13 DIAGNOSIS — I739 Peripheral vascular disease, unspecified: Secondary | ICD-10-CM | POA: Diagnosis not present

## 2023-05-13 DIAGNOSIS — M545 Low back pain, unspecified: Secondary | ICD-10-CM | POA: Diagnosis not present

## 2023-05-13 DIAGNOSIS — J310 Chronic rhinitis: Secondary | ICD-10-CM | POA: Diagnosis not present

## 2023-05-13 DIAGNOSIS — E785 Hyperlipidemia, unspecified: Secondary | ICD-10-CM | POA: Diagnosis not present

## 2023-05-13 DIAGNOSIS — M353 Polymyalgia rheumatica: Secondary | ICD-10-CM | POA: Diagnosis not present

## 2023-05-13 DIAGNOSIS — I6522 Occlusion and stenosis of left carotid artery: Secondary | ICD-10-CM | POA: Diagnosis not present

## 2023-05-13 DIAGNOSIS — Z8546 Personal history of malignant neoplasm of prostate: Secondary | ICD-10-CM | POA: Diagnosis not present

## 2023-05-13 NOTE — Telephone Encounter (Signed)
Left message on voicemail per DPR in reference to upcoming appointment scheduled on 05/18/2023 at 10:30 with detailed instructions given per Myocardial Perfusion Study Information Sheet for the test. LM to arrive 15 minutes early, and that it is imperative to arrive on time for appointment to keep from having the test rescheduled. If you need to cancel or reschedule your appointment, please call the office within 24 hours of your appointment. Failure to do so may result in a cancellation of your appointment, and a $50 no show fee. Phone number given for call back for any questions.

## 2023-05-14 ENCOUNTER — Other Ambulatory Visit: Payer: Self-pay | Admitting: Internal Medicine

## 2023-05-14 ENCOUNTER — Other Ambulatory Visit (HOSPITAL_COMMUNITY): Payer: Self-pay

## 2023-05-14 DIAGNOSIS — I493 Ventricular premature depolarization: Secondary | ICD-10-CM

## 2023-05-14 MED ORDER — METOPROLOL SUCCINATE ER 25 MG PO TB24
12.5000 mg | ORAL_TABLET | Freq: Every day | ORAL | 2 refills | Status: DC
  Filled 2023-05-14: qty 15, 30d supply, fill #0
  Filled 2023-06-06: qty 15, 30d supply, fill #1
  Filled 2023-07-07: qty 15, 30d supply, fill #2

## 2023-05-15 DIAGNOSIS — R220 Localized swelling, mass and lump, head: Secondary | ICD-10-CM | POA: Diagnosis not present

## 2023-05-15 DIAGNOSIS — R600 Localized edema: Secondary | ICD-10-CM | POA: Diagnosis not present

## 2023-05-15 DIAGNOSIS — M47812 Spondylosis without myelopathy or radiculopathy, cervical region: Secondary | ICD-10-CM | POA: Diagnosis not present

## 2023-05-18 ENCOUNTER — Ambulatory Visit (HOSPITAL_COMMUNITY)
Admission: RE | Admit: 2023-05-18 | Discharge: 2023-05-18 | Disposition: A | Discharge status: Home / Self Care | Payer: Medicare HMO | Source: Ambulatory Visit | Attending: Cardiology | Admitting: Cardiology

## 2023-05-18 DIAGNOSIS — I493 Ventricular premature depolarization: Secondary | ICD-10-CM | POA: Insufficient documentation

## 2023-05-18 DIAGNOSIS — R42 Dizziness and giddiness: Secondary | ICD-10-CM | POA: Insufficient documentation

## 2023-05-18 LAB — MYOCARDIAL PERFUSION IMAGING
Estimated workload: 5.8
Exercise duration (min): 4 min
LV dias vol: 93 mL (ref 62–150)
LV sys vol: 52 mL
MPHR: 130 {beats}/min
Nuc Stress EF: 45 %
Peak HR: 146 {beats}/min
Percent HR: 112 %
RPE: 19
Rest HR: 60 {beats}/min
Rest Nuclear Isotope Dose: 10.5 mCi
SDS: 1
SRS: 0
SSS: 1
ST Depression (mm): 0 mm
Stress Nuclear Isotope Dose: 31.9 mCi
TID: 0.93

## 2023-05-18 MED ORDER — TECHNETIUM TC 99M TETROFOSMIN IV KIT
31.9000 | PACK | Freq: Once | INTRAVENOUS | Status: AC | PRN
  Administered 2023-05-18: 31.9 via INTRAVENOUS

## 2023-05-18 MED ORDER — TECHNETIUM TC 99M TETROFOSMIN IV KIT
10.5000 | PACK | Freq: Once | INTRAVENOUS | Status: AC | PRN
  Administered 2023-05-18: 10.5 via INTRAVENOUS

## 2023-06-22 DIAGNOSIS — M25551 Pain in right hip: Secondary | ICD-10-CM | POA: Diagnosis not present

## 2023-06-22 DIAGNOSIS — M7061 Trochanteric bursitis, right hip: Secondary | ICD-10-CM | POA: Diagnosis not present

## 2023-06-22 DIAGNOSIS — M1611 Unilateral primary osteoarthritis, right hip: Secondary | ICD-10-CM | POA: Diagnosis not present

## 2023-07-13 ENCOUNTER — Other Ambulatory Visit: Payer: Self-pay | Admitting: Otolaryngology

## 2023-07-13 ENCOUNTER — Telehealth: Payer: Self-pay | Admitting: *Deleted

## 2023-07-13 NOTE — Telephone Encounter (Signed)
   Name: Jerry Booth  DOB: 12-09-1930  MRN: [id number]  Primary Cardiologist: Alvan Ronal BRAVO, MD   Preoperative team, please contact this patient and set up a phone call appointment for further preoperative risk assessment. Please obtain consent and complete medication review. Thank you for your help.  I confirm that guidance regarding antiplatelet and oral anticoagulation therapy has been completed and, if necessary, noted below.Patient is on ASA but no request to hold   I also confirmed the patient resides in the state of [place] . As per [facility] Medical Board telemedicine laws, the patient must reside in the state in which the provider is licensed.   Lamarr Satterfield, NP 07/13/2023, 4:48 PM [facility]

## 2023-07-13 NOTE — Telephone Encounter (Signed)
 S/w the pt about tele preop appt. I did offer the pt an appt 07/15/23 with Dr. Alvan and did he wan to move his appt up as he needs pre op clearance now. Pt answered yes. We decided not to cancel 08/03/23 with Dr. Alvan at this time, pt will d/w MD if 08/2023 appt still needs to be.   Pt opted to be seen sooner in the office than a tele appt late Jan 2025.   I will update all parties involved.

## 2023-07-13 NOTE — Telephone Encounter (Signed)
   Pre-operative Risk Assessment    Patient Name: Jerry Booth  DOB: 1931-05-21 MRN: [id number]   Date of last office visit: 05/01/2023 Date of next office visit: 08/03/2023   Request for Surgical Clearance    Procedure:   Sinus Endoscopic Surgery  Date of Surgery:  Clearance 08/05/23                                 Surgeon:  Dr. Vaughan Ricker Surgeon's Group or Practice Name:  [facility] Phone number:  [phone] Fax number:  [phone]   Type of Clearance Requested:   - Medical  - Pharmacy:  Hold Aspirin Not Indicated.   Type of Anesthesia:  Not Indicated   Additional requests/questions:    Signed, Edsel Grayce Sanders   07/13/2023, 4:06 PM

## 2023-07-15 ENCOUNTER — Encounter: Payer: Self-pay | Admitting: Internal Medicine

## 2023-07-15 ENCOUNTER — Ambulatory Visit: Payer: Medicare HMO | Attending: Internal Medicine | Admitting: Internal Medicine

## 2023-07-15 DIAGNOSIS — I493 Ventricular premature depolarization: Secondary | ICD-10-CM

## 2023-07-15 MED ORDER — METOPROLOL SUCCINATE ER 25 MG PO TB24: 12.5000 mg | ORAL_TABLET | Freq: Every day | ORAL | 3 refills | Status: DC

## 2023-07-15 NOTE — Progress Notes (Signed)
 Cardiology Office Note:  .   Date:  07/15/2023  ID:  Jerry Booth, DOB 20-Oct-1930, MRN [id number] PCP: Windell Hasty, DO  [facility] Providers Cardiologist:  Bridgette Campus, MD    History of Present Illness: .   Jerry Booth is a 89 y.o. male  , hx of polymyalgia rheumatica, he has cerebral vascular disease, his EF is 25 to 60% on echo in July 2023, he had normal RV function and did not have any significant valvular disease.    He comes in today because of dizziness.  He has noted dizziness since at least this summer.  He had a CT angio head and neck which showed severe right and moderate left P2 PCA stenosis, severe left cavernous ICA stenosis, severe right M2 MCA stenosis, severe bilateral vertebral artery origin stenosis, and approximately 60% stenosis of the left proximal ICA in the neck.  He had an MRI that showed age indeterminant subarachnoid hemorrhage identified on February 13, 2023.  Follow-up CT showed no acute changes.  He has been managed with aspirin and statin.  He has no history of cardiovascular disease.  Today, Discussed the use of AI scribe software for clinical note transcription with the patient, who gave verbal consent to proceed.  History of Present Illness   He reports consistent dizziness and skipped heartbeats. The patient is physically active, engaging in golf and yard work multiple times a week. The dizziness is not positional and typically lasts for about 30-45 seconds. There are no associated symptoms such as chest pressure, shortness of breath, or changes in pulse. He has previously been treated with prednisone for polymyalgia rheumatica.     ROS:  per HPI otherwise negative  Interim hx 07/25/2023 He has a nasal polyp planned for removal. He is getting leg cramps at night. He wonders if the BB is associated. No palpitations. He can do a flight of stairs without symptoms.    Studies Reviewed: .          Risk Assessment/Calculations:         Physical Exam:   VS:   Vitals:   07/15/23 1102  BP: 124/68  Pulse: 70  SpO2: 97%     Wt Readings from Last 3 Encounters:  05/18/23 162 lb (73.5 kg)  05/01/23 162 lb (73.5 kg)  07/08/21 159 lb (72.1 kg)    GEN: Well nourished, well developed in no acute distress NECK: No JVD; No carotid bruits CARDIAC: RRR, no murmurs, rubs, gallops RESPIRATORY:  Clear to auscultation without rales, wheezing or rhonchi  ABDOMEN: Soft, non-tender, non-distended EXTREMITIES:  No edema; No deformity   ASSESSMENT AND PLAN: .   Preop He presents for pre-op for sinus enoscopic surgery. He can walk > 4 METS without shortness of breath or chest pressure. He had a recent low risk SPECT. 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 He has a 10 % burden. SPECT was negative for ischemia. He is was hesitant to start medications initially, added metop XL 12.5 mg daily. He may be an ablation candidate if his burden is high and he does not tolerate medications. He is fairly active.  Dizziness It was not felt that his dizziness was related to vertebrobasilar ischemia. No signs of ischemia from his SPECT  Leg Cramps We discussed that beta-blocker is unlikely the cause of his cramping.  He has been on his statin for many years and this is recent, so unlikely  the cause.  We discussed trying to supplement potassium and see if this helps.       Dispo: Follow up 6 months with an APP  Signed, Laylee Schooley, Tomas Fountain, MD

## 2023-07-15 NOTE — Patient Instructions (Signed)
 Medication Instructions:  NO CHANGES Metoprolol  Succinate has been sent to [facility]  Follow-Up: At [facility], you and your health needs are our priority.  As part of our continuing mission to provide you with exceptional heart care, we have created designated Provider Care Teams.  These Care Teams include your primary Cardiologist (physician) and Advanced Practice Providers (APPs -  Physician Assistants and Nurse Practitioners) who all work together to provide you with the care you need, when you need it.  We recommend signing up for the patient portal called "MyChart".  Sign up information is provided on this After Visit Summary.  MyChart is used to connect with patients for Virtual Visits (Telemedicine).  Patients are able to view lab/test results, encounter notes, upcoming appointments, etc.  Non-urgent messages can be sent to your provider as well.   To learn more about what you can do with MyChart, go to [email or web address].    Your next appointment:   6 months with PA or NP CALL in April for a July appointment     1st Floor: - Lobby - Registration  - Pharmacy  - Lab - Cafe  2nd Floor: - PV Lab - Diagnostic Testing (echo, CT, nuclear med)  3rd Floor: - Vacant  4th Floor: - TCTS (cardiothoracic surgery) - AFib Clinic - Structural Heart Clinic - Vascular Surgery  - Vascular Ultrasound  5th Floor: - HeartCare Cardiology (general and EP) - Clinical Pharmacy for coumadin, hypertension, lipid, weight-loss medications, and med management appointments    Valet parking services will be available as well.

## 2023-07-20 DIAGNOSIS — Z85828 Personal history of other malignant neoplasm of skin: Secondary | ICD-10-CM | POA: Diagnosis not present

## 2023-07-20 DIAGNOSIS — L309 Dermatitis, unspecified: Secondary | ICD-10-CM | POA: Diagnosis not present

## 2023-07-20 DIAGNOSIS — L821 Other seborrheic keratosis: Secondary | ICD-10-CM | POA: Diagnosis not present

## 2023-07-20 DIAGNOSIS — L812 Freckles: Secondary | ICD-10-CM | POA: Diagnosis not present

## 2023-07-20 DIAGNOSIS — L570 Actinic keratosis: Secondary | ICD-10-CM | POA: Diagnosis not present

## 2023-07-23 DIAGNOSIS — M25551 Pain in right hip: Secondary | ICD-10-CM | POA: Diagnosis not present

## 2023-08-03 ENCOUNTER — Encounter (HOSPITAL_COMMUNITY): Payer: Self-pay | Admitting: Otolaryngology

## 2023-08-03 ENCOUNTER — Encounter: Payer: Self-pay | Admitting: Internal Medicine

## 2023-08-03 ENCOUNTER — Ambulatory Visit: Payer: Medicare HMO | Admitting: Internal Medicine

## 2023-08-03 ENCOUNTER — Other Ambulatory Visit: Payer: Self-pay

## 2023-08-03 ENCOUNTER — Other Ambulatory Visit: Payer: Self-pay | Admitting: *Deleted

## 2023-08-03 NOTE — Progress Notes (Addendum)
PCP - Charlane Ferretti, DO Cardiologist - Dr Carolan Clines (Clearance on 07/15/23)  Chest x-ray - n/a EKG - 05/01/23 Stress Test - 05/18/23 ECHO - 01/15/22 Cardiac Cath - n/a  ICD Pacemaker/Loop - n/a  Sleep Study -  n/a  Diabetes - n/a  Blood Thinner Instructions:  n/a  Aspirin Instructions: No need to hold ASA for this procedure per MD on 07/13/23.  ERAS - Clear liquids til 9:15 AM DOS.  Anesthesia review: Yes  STOP now taking any Aspirin (unless otherwise instructed by your surgeon), Aleve, Naproxen, Ibuprofen, Motrin, Advil, Goody's, BC's, all herbal medications, fish oil, and all vitamins.   Coronavirus Screening Do you have any of the following symptoms:  Cough yes/no: No Fever (>100.42F)  yes/no: No Runny nose yes/no: No Sore throat yes/no: No Difficulty breathing/shortness of breath  yes/no: No  Have you traveled in the last 14 days and where? yes/no: No  Patient verbalized understanding of instructions that were given via phone.

## 2023-08-04 NOTE — Progress Notes (Signed)
Patient was called to be informed that the surgery time for tomorrow was changed. Patient was instructed to be at the hospital at 08:15 o'clock and stop drinking clear liquids at 07:45 o'clock. Patient verbalized understanding.

## 2023-08-04 NOTE — Anesthesia Preprocedure Evaluation (Addendum)
 Anesthesia Evaluation  Patient identified by MRN, date of birth, ID band Patient awake    Reviewed: Allergy & Precautions, H&P , NPO status , Patient's Chart, lab work & pertinent test results  Airway Mallampati: III  TM Distance: >3 FB Neck ROM: Full    Dental  (+) Teeth Intact, Dental Advisory Given   Pulmonary neg pulmonary ROS, former smoker   Pulmonary exam normal breath sounds clear to auscultation       Cardiovascular hypertension (176/97 preop, per pt normally 130-140SBP), Pt. on medications Normal cardiovascular exam Rhythm:Regular Rate:Normal  follows with cardiology for hx of cerebral vascular disease and PVCs. He had a CT angio head and neck 12/2022 which showed severe right and moderate left P2 PCA stenosis, severe left cavernous ICA stenosis, severe right M2 MCA stenosis, severe bilateral vertebral artery origin stenosis, and approximately 60% stenosis of the left proximal ICA in the neck. He had an MRI that showed age indeterminant subarachnoid hemorrhage identified on February 13, 2023. He has been managed with asa and statin and also followed by IR. Recent event monitor 05/2023 showed PVC burden 10%; he was subsequently started on metoprolol . Nuclear stress 05/2023 was low risk. Prior echo 12/2021 showed EF 55-60%, grade 1 dd, no significant valvular abnormalities. Last seen by Dr. Ronal Ross 07/15/23 for preop eval. Per note, He presents for pre-op for sinus enoscopic surgery. He can walk > 4 METS without shortness of breath or chest pressure. He had a recent low risk SPECT. He is acceptable cardiac risk for low risk procedure.     Neuro/Psych negative neurological ROS  negative psych ROS   GI/Hepatic negative GI ROS, Neg liver ROS,,,  Endo/Other  negative endocrine ROS    Renal/GU negative Renal ROS  negative genitourinary   Musculoskeletal negative musculoskeletal ROS (+)    Abdominal   Peds negative pediatric  ROS (+)  Hematology negative hematology ROS (+)   Anesthesia Other Findings   Reproductive/Obstetrics negative OB ROS                             Anesthesia Physical Anesthesia Plan  ASA: 3  Anesthesia Plan: General   Post-op Pain Management: Tylenol  PO (pre-op)*   Induction: Intravenous  PONV Risk Score and Plan: 2 and Ondansetron , Dexamethasone  and Treatment may vary due to age or medical condition  Airway Management Planned: Oral ETT  Additional Equipment: None  Intra-op Plan:   Post-operative Plan: Extubation in OR  Informed Consent: I have reviewed the patients History and Physical, chart, labs and discussed the procedure including the risks, benefits and alternatives for the proposed anesthesia with the patient or authorized representative who has indicated his/her understanding and acceptance.     Dental advisory given  Plan Discussed with: CRNA  Anesthesia Plan Comments: (  )        Anesthesia Quick Evaluation

## 2023-08-04 NOTE — Progress Notes (Signed)
 Anesthesia Chart Review: Same day workup  89 yo male follows with cardiology for hx of cerebral vascular disease and PVCs. He had a CT angio head and neck 12/2022 which showed severe right and moderate left P2 PCA stenosis, severe left cavernous ICA stenosis, severe right M2 MCA stenosis, severe bilateral vertebral artery origin stenosis, and approximately 60% stenosis of the left proximal ICA in the neck. He had an MRI that showed age indeterminant subarachnoid hemorrhage identified on February 13, 2023. He has been managed with asa and statin and also followed by IR. Recent event monitor 05/2023 showed PVC burden 10%; he was subsequently started on metoprolol . Nuclear stress 05/2023 was low risk. Prior echo 12/2021 showed EF 55-60%, grade 1 dd, no significant valvular abnormalities. Last seen by Dr. Ronal Ross 07/15/23 for preop eval. Per note, He presents for pre-op for sinus enoscopic surgery. He can walk > 4 METS without shortness of breath or chest pressure. He had a recent low risk SPECT. He is acceptable cardiac risk for low risk procedure.  Follows with rheumatology for hx of polymyalgia rheumatica. Uses prednisone PRN.  Will need DOS labs and eval.  EKG 05/01/23: Sinus rhythm with 1st degree A-V block with frequent Premature ventricular complexes and Premature atrial Complexes. Rate 89. Right bundle branch block  Nuclear stress 05/18/23: Normal perfusion Mild systolic dysfunction (EF 45%).  Patient having PVCs that may have affected gating, suggest echocardiogram to better evaluate systolic function Low risk study  TTE 01/15/22:  1. Left ventricular ejection fraction, by estimation, is 55 to 60%. The  left ventricle has normal function. The left ventricle has no regional  wall motion abnormalities. Left ventricular diastolic parameters are  consistent with Grade I diastolic  dysfunction (impaired relaxation). The average left ventricular global  longitudinal strain is -19.6 %. The global  longitudinal strain is normal.   2. Right ventricular systolic function is normal. The right ventricular  size is normal. Tricuspid regurgitation signal is inadequate for assessing  PA pressure.   3. No evidence of mitral valve regurgitation. Moderate mitral annular  calcification.   4. Aortic valve regurgitation is not visualized. Aortic valve  sclerosis/calcification is present, without any evidence of aortic  stenosis.   5. The inferior vena cava is normal in size with greater than 50%  respiratory variability, suggesting right atrial pressure of 3 mmHg.    Lynwood Geofm RIGGERS [facility] Short Stay Center/Anesthesiology Phone [phone] 08/04/2023 10:21 AM

## 2023-08-05 ENCOUNTER — Ambulatory Visit (HOSPITAL_BASED_OUTPATIENT_CLINIC_OR_DEPARTMENT_OTHER): Payer: Self-pay | Admitting: Physician Assistant

## 2023-08-05 ENCOUNTER — Encounter (HOSPITAL_COMMUNITY)
Admission: RE | Disposition: A | Discharge status: Home / Self Care | Payer: Self-pay | Source: Home / Self Care | Attending: Otolaryngology

## 2023-08-05 ENCOUNTER — Ambulatory Visit (HOSPITAL_COMMUNITY): Payer: Self-pay | Admitting: Physician Assistant

## 2023-08-05 ENCOUNTER — Other Ambulatory Visit: Payer: Self-pay

## 2023-08-05 ENCOUNTER — Ambulatory Visit (HOSPITAL_COMMUNITY)
Admission: RE | Admit: 2023-08-05 | Discharge: 2023-08-05 | Disposition: A | Discharge status: Home / Self Care | Payer: Medicare HMO | Attending: Otolaryngology | Admitting: Otolaryngology

## 2023-08-05 ENCOUNTER — Encounter (HOSPITAL_COMMUNITY): Payer: Self-pay | Admitting: Otolaryngology

## 2023-08-05 DIAGNOSIS — D140 Benign neoplasm of middle ear, nasal cavity and accessory sinuses: Secondary | ICD-10-CM | POA: Diagnosis present

## 2023-08-05 DIAGNOSIS — J339 Nasal polyp, unspecified: Secondary | ICD-10-CM | POA: Diagnosis not present

## 2023-08-05 DIAGNOSIS — M353 Polymyalgia rheumatica: Secondary | ICD-10-CM | POA: Insufficient documentation

## 2023-08-05 DIAGNOSIS — I679 Cerebrovascular disease, unspecified: Secondary | ICD-10-CM | POA: Diagnosis not present

## 2023-08-05 DIAGNOSIS — I10 Essential (primary) hypertension: Secondary | ICD-10-CM

## 2023-08-05 DIAGNOSIS — D367 Benign neoplasm of other specified sites: Secondary | ICD-10-CM | POA: Diagnosis not present

## 2023-08-05 DIAGNOSIS — Z87891 Personal history of nicotine dependence: Secondary | ICD-10-CM | POA: Diagnosis not present

## 2023-08-05 DIAGNOSIS — J3489 Other specified disorders of nose and nasal sinuses: Secondary | ICD-10-CM | POA: Diagnosis not present

## 2023-08-05 HISTORY — PX: SINUS ENDO W/FUSION: SHX777

## 2023-08-05 HISTORY — PX: SINUS ENDO WITH FUSION: SHX5329

## 2023-08-05 HISTORY — DX: Pneumonia, unspecified organism: J18.9

## 2023-08-05 LAB — BASIC METABOLIC PANEL
Anion gap: 12 (ref 5–15)
BUN: 21 mg/dL (ref 8–23)
CO2: 25 mmol/L (ref 22–32)
Calcium: 8.9 mg/dL (ref 8.9–10.3)
Chloride: 102 mmol/L (ref 98–111)
Creatinine, Ser: 0.99 mg/dL (ref 0.61–1.24)
GFR, Estimated: >60 mL/min (ref >60)
Glucose, Bld: 107 mg/dL — ABNORMAL HIGH (ref 70–99)
Potassium: 4 mmol/L (ref 3.5–5.1)
Sodium: 139 mmol/L (ref 135–145)

## 2023-08-05 LAB — CBC
HCT: 43.4 % (ref 39.0–52.0)
Hemoglobin: 14.2 g/dL (ref 13.0–17.0)
MCH: 33.1 pg (ref 26.0–34.0)
MCHC: 32.7 g/dL (ref 30.0–36.0)
MCV: 101.2 fL — ABNORMAL HIGH (ref 80.0–100.0)
Platelets: 162 10*3/uL (ref 150–400)
RBC: 4.29 MIL/uL (ref 4.22–5.81)
RDW: 16.2 % — ABNORMAL HIGH (ref 11.5–15.5)
WBC: 5 10*3/uL (ref 4.0–10.5)
nRBC: 0 % (ref 0.0–0.2)

## 2023-08-05 SURGERY — SURGERY, PARANASAL SINUS, ENDOSCOPIC, WITH NASAL SEPTOPLASTY, TURBINOPLASTY, AND MAXILLARY SINUSOTOMY
Anesthesia: General | Site: Nose | Laterality: Right

## 2023-08-05 MED ORDER — OXYMETAZOLINE HCL 0.05 % NA SOLN
NASAL | Status: AC
  Filled 2023-08-05: qty 30

## 2023-08-05 MED ORDER — FENTANYL CITRATE (PF) 100 MCG/2ML IJ SOLN
INTRAMUSCULAR | Status: AC
  Filled 2023-08-05: qty 2

## 2023-08-05 MED ORDER — SODIUM CHLORIDE 0.9 % IR SOLN
Status: DC | PRN
  Administered 2023-08-05: 1000 mL

## 2023-08-05 MED ORDER — FENTANYL CITRATE (PF) 100 MCG/2ML IJ SOLN
25.0000 ug | INTRAMUSCULAR | Status: DC | PRN
  Administered 2023-08-05: 25 ug via INTRAVENOUS

## 2023-08-05 MED ORDER — FENTANYL CITRATE (PF) 250 MCG/5ML IJ SOLN
INTRAMUSCULAR | Status: AC
  Filled 2023-08-05: qty 5

## 2023-08-05 MED ORDER — ARTIFICIAL TEARS OPHTHALMIC OINT
TOPICAL_OINTMENT | OPHTHALMIC | Status: DC | PRN
  Administered 2023-08-05: 1 via OPHTHALMIC

## 2023-08-05 MED ORDER — CHLORHEXIDINE GLUCONATE 0.12 % MT SOLN
15.0000 mL | OROMUCOSAL | Status: AC
  Filled 2023-08-05: qty 15

## 2023-08-05 MED ORDER — OXYMETAZOLINE HCL 0.05 % NA SOLN
NASAL | Status: DC | PRN
  Administered 2023-08-05: 1 via TOPICAL

## 2023-08-05 MED ORDER — LACTATED RINGERS IV SOLN: Freq: Once | INTRAVENOUS | Status: AC

## 2023-08-05 MED ORDER — ROCURONIUM BROMIDE 10 MG/ML (PF) SYRINGE
PREFILLED_SYRINGE | INTRAVENOUS | Status: DC | PRN
  Administered 2023-08-05: 40 mg via INTRAVENOUS

## 2023-08-05 MED ORDER — PROPOFOL 10 MG/ML IV BOLUS
INTRAVENOUS | Status: DC | PRN
  Administered 2023-08-05: 100 mg via INTRAVENOUS

## 2023-08-05 MED ORDER — LIDOCAINE 2% (20 MG/ML) 5 ML SYRINGE
INTRAMUSCULAR | Status: DC | PRN
  Administered 2023-08-05: 60 mg via INTRAVENOUS

## 2023-08-05 MED ORDER — ONDANSETRON HCL 4 MG/2ML IJ SOLN
INTRAMUSCULAR | Status: DC | PRN
  Administered 2023-08-05: 4 mg via INTRAVENOUS

## 2023-08-05 MED ORDER — DEXMEDETOMIDINE HCL IN NACL 80 MCG/20ML IV SOLN
INTRAVENOUS | Status: AC
  Filled 2023-08-05: qty 20

## 2023-08-05 MED ORDER — LIDOCAINE-EPINEPHRINE 1 %-1:100000 IJ SOLN
INTRAMUSCULAR | Status: AC
  Filled 2023-08-05: qty 1

## 2023-08-05 MED ORDER — LACTATED RINGERS IV SOLN: INTRAVENOUS | Status: DC | PRN

## 2023-08-05 MED ORDER — PHENYLEPHRINE 80 MCG/ML (10ML) SYRINGE FOR IV PUSH (FOR BLOOD PRESSURE SUPPORT)
PREFILLED_SYRINGE | INTRAVENOUS | Status: DC | PRN
  Administered 2023-08-05: 80 ug via INTRAVENOUS

## 2023-08-05 MED ORDER — ACETAMINOPHEN 500 MG PO TABS
1000.0000 mg | ORAL_TABLET | Freq: Once | ORAL | Status: AC
  Administered 2023-08-05: 1000 mg via ORAL
  Filled 2023-08-05: qty 2

## 2023-08-05 MED ORDER — CHLORHEXIDINE GLUCONATE 0.12 % MT SOLN
OROMUCOSAL | Status: AC
  Administered 2023-08-05: 15 mL via OROMUCOSAL
  Filled 2023-08-05: qty 15

## 2023-08-05 MED ORDER — SUGAMMADEX SODIUM 200 MG/2ML IV SOLN
INTRAVENOUS | Status: DC | PRN
  Administered 2023-08-05: 200 mg via INTRAVENOUS

## 2023-08-05 MED ORDER — PROPOFOL 1000 MG/100ML IV EMUL
INTRAVENOUS | Status: AC
  Filled 2023-08-05: qty 100

## 2023-08-05 MED ORDER — PHENYLEPHRINE HCL-NACL 20-0.9 MG/250ML-% IV SOLN
INTRAVENOUS | Status: DC | PRN
  Administered 2023-08-05: 30 ug/min via INTRAVENOUS

## 2023-08-05 MED ORDER — MUPIROCIN 2 % EX OINT
TOPICAL_OINTMENT | CUTANEOUS | Status: AC
  Filled 2023-08-05: qty 22

## 2023-08-05 MED ORDER — DEXAMETHASONE SODIUM PHOSPHATE 10 MG/ML IJ SOLN
INTRAMUSCULAR | Status: DC | PRN
  Administered 2023-08-05: 4 mg via INTRAVENOUS

## 2023-08-05 MED ORDER — MUPIROCIN 2 % EX OINT
TOPICAL_OINTMENT | CUTANEOUS | Status: DC | PRN
  Administered 2023-08-05: 1 via TOPICAL

## 2023-08-05 MED ORDER — FENTANYL CITRATE (PF) 100 MCG/2ML IJ SOLN: 25.0000 ug | INTRAMUSCULAR | Status: DC | PRN

## 2023-08-05 MED ORDER — LIDOCAINE-EPINEPHRINE 1 %-1:100000 IJ SOLN
INTRAMUSCULAR | Status: DC | PRN
  Administered 2023-08-05: 10 mL

## 2023-08-05 MED ORDER — ONDANSETRON HCL 4 MG/2ML IJ SOLN: 4.0000 mg | Freq: Once | INTRAMUSCULAR | Status: DC | PRN

## 2023-08-05 MED ORDER — AMISULPRIDE (ANTIEMETIC) 5 MG/2ML IV SOLN: 10.0000 mg | Freq: Once | INTRAVENOUS | Status: DC | PRN

## 2023-08-05 MED ORDER — FENTANYL CITRATE (PF) 250 MCG/5ML IJ SOLN
INTRAMUSCULAR | Status: DC | PRN
  Administered 2023-08-05: 50 ug via INTRAVENOUS

## 2023-08-05 SURGICAL SUPPLY — 50 items
ATTRACTOMAT 16X20 MAGNETIC DRP (DRAPES) IMPLANT
BAG COUNTER SPONGE SURGICOUNT (BAG) ×3 IMPLANT
BLADE RAD40 ROTATE 4M 4 5PK (BLADE) IMPLANT
BLADE RAD60 ROTATE M4 4 5PK (BLADE) IMPLANT
BLADE ROTATE TRICUT 4X13 M4 (BLADE) IMPLANT
BLADE SURG 15 STRL LF DISP TIS (BLADE) IMPLANT
BLADE TRICUT ROTATE M4 4 5PK (BLADE) ×3 IMPLANT
CANISTER SUCT 3000ML PPV (MISCELLANEOUS) ×3 IMPLANT
CLSR STERI-STRIP ANTIMIC 1/2X4 (GAUZE/BANDAGES/DRESSINGS) ×3 IMPLANT
COAGULATOR SUCT SWTCH 10FR 6 (ELECTROSURGICAL) IMPLANT
DRAPE HALF SHEET 40X57 (DRAPES) IMPLANT
DRESSING NASAL POPE 10X1.5X2.5 (GAUZE/BANDAGES/DRESSINGS) IMPLANT
DRSG NASAL POPE 10X1.5X2.5 (GAUZE/BANDAGES/DRESSINGS)
DRSG NASOPORE 8CM (GAUZE/BANDAGES/DRESSINGS) ×1 IMPLANT
DRSG TELFA 3X8 NADH STRL (GAUZE/BANDAGES/DRESSINGS) IMPLANT
ELECT REM PT RETURN 9FT ADLT (ELECTROSURGICAL)
ELECTRODE REM PT RTRN 9FT ADLT (ELECTROSURGICAL) IMPLANT
FILTER ARTHROSCOPY CONVERTOR (FILTER) ×3 IMPLANT
GLOVE BIO SURGEON STRL SZ7.5 (GLOVE) ×3 IMPLANT
GLOVE ECLIPSE 7.5 STRL STRAW (GLOVE) ×3 IMPLANT
GOWN STRL REUS W/ TWL LRG LVL3 (GOWN DISPOSABLE) ×6 IMPLANT
KIT BASIN OR (CUSTOM PROCEDURE TRAY) ×3 IMPLANT
KIT TURNOVER KIT B (KITS) ×3 IMPLANT
NDL HYPO 25GX1X1/2 BEV (NEEDLE) IMPLANT
NDL PRECISIONGLIDE 27X1.5 (NEEDLE) ×2 IMPLANT
NDL SPNL 22GX3.5 QUINCKE BK (NEEDLE) ×2 IMPLANT
NDL SPNL 25GX3.5 QUINCKE BL (NEEDLE) ×2 IMPLANT
NEEDLE HYPO 25GX1X1/2 BEV (NEEDLE)
NEEDLE PRECISIONGLIDE 27X1.5 (NEEDLE) ×2
NEEDLE SPNL 22GX3.5 QUINCKE BK (NEEDLE) ×2
NEEDLE SPNL 25GX3.5 QUINCKE BL (NEEDLE) ×2
NS IRRIG 1000ML POUR BTL (IV SOLUTION) ×3 IMPLANT
PAD ARMBOARD 7.5X6 YLW CONV (MISCELLANEOUS) ×6 IMPLANT
PAD ENT ADHESIVE 25PK (MISCELLANEOUS) ×3 IMPLANT
PATTIES SURGICAL .5 X3 (DISPOSABLE) ×3 IMPLANT
POSITIONER HEAD DONUT 9IN (MISCELLANEOUS) IMPLANT
SHEATH ENDOSCRUB 0 DEG (SHEATH) ×3 IMPLANT
SHEATH ENDOSCRUB 30 DEG (SHEATH) ×3 IMPLANT
SHEATH ENDOSCRUB 45 DEG (SHEATH) IMPLANT
SOL ANTI FOG 6CC (MISCELLANEOUS) ×3 IMPLANT
SUT ETHILON 3 0 PS 1 (SUTURE) IMPLANT
SWAB COLLECTION DEVICE MRSA (MISCELLANEOUS) IMPLANT
SYR 50ML SLIP (SYRINGE) IMPLANT
TOWEL GREEN STERILE FF (TOWEL DISPOSABLE) ×3 IMPLANT
TRACKER ENT INSTRUMENT (MISCELLANEOUS) ×1 IMPLANT
TRACKER ENT PATIENT (MISCELLANEOUS) ×1 IMPLANT
TRAY ENT MC OR (CUSTOM PROCEDURE TRAY) ×3 IMPLANT
TUBE CONNECTING 12X1/4 (SUCTIONS) ×3 IMPLANT
TUBING STRAIGHTSHOT EPS 5PK (TUBING) ×1 IMPLANT
WATER STERILE IRR 1000ML POUR (IV SOLUTION) ×3 IMPLANT

## 2023-08-05 NOTE — Op Note (Signed)
 PREOPERATIVE DIAGNOSIS:  Right nasal passage inverting papilloma   POSTOPERATIVE DIAGNOSIS:  Right nasal passage inverting papilloma   PROCEDURE:  Right nasal endoscopy with polypectomy with Fusion image guidance   SURGEON:  Vaughan Ricker, MD   ANESTHESIA:  General endotracheal anesthesia   COMPLICATIONS:  None   INDICATIONS:  The patient is a 89 year old male with a history of right posterior nasal passage mass demonstrated to represent inverting papilloma by biopsy.  He presents to the operating room for surgical management.   FINDINGS:  Papular mass of right posterior nasal passage pedicled from medial surface of superior turbinate.   DESCRIPTION OF PROCEDURE:  The patient was identified in the holding room, informed consent having been obtained including discussion of risks, benefits and alternatives, the patient was brought to the operative suite and put the operative table in the supine position.  Anesthesia was induced and the patient was intubated by the anesthesia team without difficulty.  The eyes were lubricated and the Fusion antenna was placed.  The patient was given intravenous antibiotics during the case.  The face was prepped and draped in sterile fashion.  The patient was registered to the Fusion system in the standard fashion.  The eyes were taped closed and Afrin-soaked pledgets were placed in both sides of the nose.  After registering instruments, the right-sided pledgets were removed and the nasal passage was inspected with a straight telescope.  The lateral nasal wall was injected with local anesthetic.  The papular mass was visualized.  The bulk of the mass was amputated and removed.  The middle turbinate was lateralized for better visualization.  The mass was found to be pedicled to the medial surface of the superior turbinate.  The posterior nasal passage was injected with local anesthetic.  The superior turbinate was then largely removed using cutting forceps.  The edges were  then trimmed with the microdebrider.  After suctioning the nasal passage, a pice of a Nasopore pack was then placed in the superior meatus region coated with mupirocin  ointment.  The pack was saturated with saline.  Nasal passages and the throat were suctioned.   Drapes were removed and the patient was cleaned off.  The patient was returned to anesthesia for wakeup, extubated, and taken to the recovery room in stable condition.

## 2023-08-05 NOTE — Anesthesia Procedure Notes (Signed)
 Procedure Name: Intubation Date/Time: 08/05/2023 10:32 AM  Performed by: Lamar Lucie DASEN, CRNAPre-anesthesia Checklist: Patient identified, Emergency Drugs available, Suction available and Patient being monitored Patient Re-evaluated:Patient Re-evaluated prior to induction Oxygen Delivery Method: Circle system utilized Preoxygenation: Pre-oxygenation with 100% oxygen Induction Type: IV induction Ventilation: Mask ventilation without difficulty Laryngoscope Size: Mac and 4 Grade View: Grade II Tube type: Oral Tube size: 7.5 mm Number of attempts: 1 Airway Equipment and Method: Stylet and Oral airway Placement Confirmation: ETT inserted through vocal cords under direct vision, positive ETCO2 and breath sounds checked- equal and bilateral Secured at: 23 cm Tube secured with: Tape Dental Injury: Teeth and Oropharynx as per pre-operative assessment

## 2023-08-05 NOTE — Anesthesia Postprocedure Evaluation (Signed)
 Anesthesia Post Note  Patient: Jerry Booth  Procedure(s) Performed: RIGHT NASAL ENDOSCOPY POLYPECTOMY (Right: Nose) ENDOSCOPIC SINUS SURGERY WITH NAVIGATION (Right: Nose)     Patient location during evaluation: PACU Anesthesia Type: General Level of consciousness: awake and alert, oriented and patient cooperative Pain management: pain level controlled Vital Signs Assessment: post-procedure vital signs reviewed and stable Respiratory status: spontaneous breathing, nonlabored ventilation and respiratory function stable Cardiovascular status: blood pressure returned to baseline and stable Postop Assessment: no apparent nausea or vomiting Anesthetic complications: no Comments: Hypertensive at baseline   No notable events documented.  Last Vitals:  Vitals:   08/05/23 1215 08/05/23 1230  BP: (!) 183/99 (!) 165/101  Pulse: 72 78  Resp: 13 18  Temp:    SpO2: 93% 98%    Last Pain:  Vitals:   08/05/23 1115  TempSrc:   PainSc: 2                  Almarie CHRISTELLA Marchi

## 2023-08-05 NOTE — H&P (Signed)
 Jerry Booth is an 89 y.o. male.   Chief Complaint: Nasal polyp HPI: 89 year old male with symptomatic posterior right-sided nasal polyp.  Biopsy with features suggesting inverted papilloma.  Past Medical History:  Diagnosis Date   Carotid artery disease (HCC)    Carotid Stenosis   Hypertension    Incomplete RBBB 2019   Noted on EKG   Pneumonia    as a child   Polymyalgia rheumatica (HCC)    Prostate cancer (HCC)    implants   RBBB 09/12/2019   EKG    Past Surgical History:  Procedure Laterality Date   carpel tunnel  Left    COLONOSCOPY     DACRYOCYSTORHINOSTOMY   10/07/2013   in [place]   INGUINAL HERNIA REPAIR Right 07/15/2016   Procedure: RIGHT INGUINAL HERNIA REPAIR;  Surgeon: Krystal Russell, MD;  Location: [facility] OR;  Service: General;  Laterality: Right;   INGUINAL HERNIA REPAIR Left 09/15/2019   Procedure: LEFT INGUINAL HERNIA REPAIR WITH MESH;  Surgeon: Vanderbilt Ned, MD;  Location: [facility] ORS;  Service: General;  Laterality: Left;   INSERTION OF MESH Right 07/15/2016   Procedure: INSERTION OF MESH;  Surgeon: Krystal Russell, MD;  Location: [facility] OR;  Service: General;  Laterality: Right;   IR RADIOLOGIST EVAL & MGMT  02/10/2023   PROSTATE SURGERY     seed implants      with prostate surgery '08   SHOULDER SURGERY Bilateral    TONSILLECTOMY     VASECTOMY  1976    History reviewed. No pertinent family history. Social History:  reports that he quit smoking about 71 years ago. His smoking use included cigarettes. He has never used smokeless tobacco. He reports current alcohol  use of about 14.0 standard drinks of alcohol  per week. He reports that he does not use drugs.  Allergies:  Allergies  Allergen Reactions   Hydrocodone  Nausea Only   Oxycodone Hcl Nausea Only    Medications Prior to Admission  Medication Sig Dispense Refill   acetaminophen  (TYLENOL ) 500 MG tablet Take 500 mg by mouth every 6 (six) hours as needed for mild pain.     amLODipine (NORVASC) 2.5 MG  tablet Take 2.5 mg by mouth daily.     aspirin EC 81 MG tablet Take 81 mg by mouth daily.     atorvastatin (LIPITOR) 20 MG tablet Take 20 mg by mouth daily.      cholecalciferol (VITAMIN D3) 25 MCG (1000 UNIT) tablet Take 1,000 Units by mouth daily.     metoprolol  succinate (TOPROL  XL) 25 MG 24 hr tablet Take 0.5 tablets (12.5 mg total) by mouth daily. 45 tablet 3   naproxen sodium (ALEVE) 220 MG tablet Take 440 mg by mouth daily as needed (pain).     sodium chloride  (OCEAN) 0.65 % SOLN nasal spray Place 1 spray into both nostrils daily as needed for congestion.     Polyethyl Glycol-Propyl Glycol (LUBRICATING EYE DROPS) 0.4-0.3 % SOLN Place 1 drop into both eyes daily as needed (Eye Lubrication).      Results for orders placed or performed during the hospital encounter of 08/05/23 (from the past 48 hours)  CBC     Status: Abnormal   Collection Time: 08/05/23  8:51 AM  Result Value Ref Range   WBC 5.0 4.0 - 10.5 K/uL   RBC 4.29 4.22 - 5.81 MIL/uL   Hemoglobin 14.2 13.0 - 17.0 g/dL   HCT 56.5 60.9 - 47.9 %   MCV 101.2 (H) 80.0 -  100.0 fL   MCH 33.1 26.0 - 34.0 pg   MCHC 32.7 30.0 - 36.0 g/dL   RDW 83.7 (H) 88.4 - 84.4 %   Platelets 162 150 - 400 K/uL   nRBC 0.0 0.0 - 0.2 %    Comment: Performed at [facility], [address], [place], [place] [address]  Basic metabolic panel     Status: Abnormal   Collection Time: 08/05/23  8:51 AM  Result Value Ref Range   Sodium 139 135 - 145 mmol/L   Potassium 4.0 3.5 - 5.1 mmol/L   Chloride 102 98 - 111 mmol/L   CO2 25 22 - 32 mmol/L   Glucose, Bld 107 (H) 70 - 99 mg/dL    Comment: Glucose reference range applies only to samples taken after fasting for at least 8 hours.   BUN 21 8 - 23 mg/dL   Creatinine, Ser 9.00 0.61 - 1.24 mg/dL   Calcium 8.9 8.9 - 89.6 mg/dL   GFR, Estimated >39 >39 mL/min    Comment: (NOTE) Calculated using the CKD-EPI Creatinine Equation (2021)    Anion gap 12 5 - 15    Comment: Performed at [facility], [address], [place], [place] [address]   No results found.  Review of Systems  All other systems reviewed and are negative.   Blood pressure (!) 176/97, pulse 80, temperature 97.9 F (36.6 C), temperature source Oral, resp. rate 18, height 5' 9 (1.753 m), weight 72.6 kg, SpO2 96%. Physical Exam Constitutional:      Appearance: Normal appearance. He is normal weight.  HENT:     Head: Normocephalic and atraumatic.     Right Ear: External ear normal.     Left Ear: External ear normal.     Nose: Nose normal.     Mouth/Throat:     Mouth: Mucous membranes are moist.     Pharynx: Oropharynx is clear.  Eyes:     Extraocular Movements: Extraocular movements intact.     Pupils: Pupils are equal, round, and reactive to light.  Cardiovascular:     Rate and Rhythm: Normal rate.  Pulmonary:     Effort: Pulmonary effort is normal.  Musculoskeletal:     Cervical back: Normal range of motion.  Skin:    General: Skin is warm and dry.  Neurological:     General: No focal deficit present.     Mental Status: He is alert and oriented to person, place, and time.  Psychiatric:        Mood and Affect: Mood normal.        Behavior: Behavior normal.        Thought Content: Thought content normal.        Judgment: Judgment normal.      Assessment/Plan Right nasal passage inverted papilloma  To OR for right nasal endoscopy with polypectomy.  Vaughan Ricker, MD 08/05/2023, 9:52 AM

## 2023-08-05 NOTE — Transfer of Care (Signed)
 Immediate Anesthesia Transfer of Care Note  Patient: Jerry Booth  Procedure(s) Performed: RIGHT ENDOSCOPIC SINUS SURGERY WITH FUSION (Right: Nose) MAXILLARY ANTROSTOMY WITH TISSUE REMOVAL (Right: Nose)  Patient Location: PACU  Anesthesia Type:General  Level of Consciousness: drowsy and patient cooperative  Airway & Oxygen Therapy: Patient Spontanous Breathing  Post-op Assessment: Report given to RN, Post -op Vital signs reviewed and stable, and Patient moving all extremities X 4  Post vital signs: Reviewed and stable  Last Vitals:  Vitals Value Taken Time  BP 190/104 08/05/23 1118  Temp 36.7 C 08/05/23 1115  Pulse 71 08/05/23 1120  Resp 13 08/05/23 1120  SpO2 95 % 08/05/23 1120  Vitals shown include unfiled device data.  Last Pain:  Vitals:   08/05/23 0837  TempSrc:   PainSc: 0-No pain      Patients Stated Pain Goal: 0 (08/05/23 0837)  Complications: No notable events documented.

## 2023-08-06 ENCOUNTER — Encounter (HOSPITAL_COMMUNITY): Payer: Self-pay | Admitting: Otolaryngology

## 2023-08-06 LAB — SURGICAL PATHOLOGY

## 2023-08-16 ENCOUNTER — Emergency Department (HOSPITAL_BASED_OUTPATIENT_CLINIC_OR_DEPARTMENT_OTHER)
Admission: EM | Admit: 2023-08-16 | Discharge: 2023-08-16 | Discharge status: Against Medical Advice | Payer: Medicare HMO | Source: Home / Self Care

## 2023-08-16 ENCOUNTER — Other Ambulatory Visit: Payer: Self-pay

## 2023-08-17 DIAGNOSIS — J339 Nasal polyp, unspecified: Secondary | ICD-10-CM | POA: Diagnosis not present

## 2023-08-19 DIAGNOSIS — D140 Benign neoplasm of middle ear, nasal cavity and accessory sinuses: Secondary | ICD-10-CM | POA: Diagnosis not present

## 2023-08-20 DIAGNOSIS — M7061 Trochanteric bursitis, right hip: Secondary | ICD-10-CM | POA: Diagnosis not present

## 2023-08-20 DIAGNOSIS — M25551 Pain in right hip: Secondary | ICD-10-CM | POA: Diagnosis not present

## 2023-09-02 DIAGNOSIS — M25551 Pain in right hip: Secondary | ICD-10-CM | POA: Diagnosis not present

## 2023-09-02 DIAGNOSIS — M7061 Trochanteric bursitis, right hip: Secondary | ICD-10-CM | POA: Diagnosis not present

## 2023-09-04 DIAGNOSIS — M25551 Pain in right hip: Secondary | ICD-10-CM | POA: Diagnosis not present

## 2023-09-04 DIAGNOSIS — M7061 Trochanteric bursitis, right hip: Secondary | ICD-10-CM | POA: Diagnosis not present

## 2023-09-09 DIAGNOSIS — M7061 Trochanteric bursitis, right hip: Secondary | ICD-10-CM | POA: Diagnosis not present

## 2023-09-09 DIAGNOSIS — M25551 Pain in right hip: Secondary | ICD-10-CM | POA: Diagnosis not present

## 2023-09-11 DIAGNOSIS — Z85828 Personal history of other malignant neoplasm of skin: Secondary | ICD-10-CM | POA: Diagnosis not present

## 2023-09-11 DIAGNOSIS — M545 Low back pain, unspecified: Secondary | ICD-10-CM | POA: Diagnosis not present

## 2023-09-11 DIAGNOSIS — E785 Hyperlipidemia, unspecified: Secondary | ICD-10-CM | POA: Diagnosis not present

## 2023-09-11 DIAGNOSIS — M8580 Other specified disorders of bone density and structure, unspecified site: Secondary | ICD-10-CM | POA: Diagnosis not present

## 2023-09-11 DIAGNOSIS — M150 Primary generalized (osteo)arthritis: Secondary | ICD-10-CM | POA: Diagnosis not present

## 2023-09-11 DIAGNOSIS — M353 Polymyalgia rheumatica: Secondary | ICD-10-CM | POA: Diagnosis not present

## 2023-09-11 DIAGNOSIS — I6522 Occlusion and stenosis of left carotid artery: Secondary | ICD-10-CM | POA: Diagnosis not present

## 2023-09-11 DIAGNOSIS — I739 Peripheral vascular disease, unspecified: Secondary | ICD-10-CM | POA: Diagnosis not present

## 2023-09-11 DIAGNOSIS — Z8546 Personal history of malignant neoplasm of prostate: Secondary | ICD-10-CM | POA: Diagnosis not present

## 2023-09-11 DIAGNOSIS — I10 Essential (primary) hypertension: Secondary | ICD-10-CM | POA: Diagnosis not present

## 2023-09-16 DIAGNOSIS — Z8709 Personal history of other diseases of the respiratory system: Secondary | ICD-10-CM | POA: Diagnosis not present

## 2023-09-16 DIAGNOSIS — Z9889 Other specified postprocedural states: Secondary | ICD-10-CM | POA: Diagnosis not present

## 2023-09-18 DIAGNOSIS — M25551 Pain in right hip: Secondary | ICD-10-CM | POA: Diagnosis not present

## 2023-09-18 DIAGNOSIS — M7061 Trochanteric bursitis, right hip: Secondary | ICD-10-CM | POA: Diagnosis not present

## 2023-09-21 DIAGNOSIS — M25551 Pain in right hip: Secondary | ICD-10-CM | POA: Diagnosis not present

## 2023-12-21 DIAGNOSIS — H02105 Unspecified ectropion of left lower eyelid: Secondary | ICD-10-CM | POA: Diagnosis not present

## 2023-12-21 DIAGNOSIS — Z961 Presence of intraocular lens: Secondary | ICD-10-CM | POA: Diagnosis not present

## 2023-12-21 DIAGNOSIS — H26492 Other secondary cataract, left eye: Secondary | ICD-10-CM | POA: Diagnosis not present

## 2024-01-18 DIAGNOSIS — Z85828 Personal history of other malignant neoplasm of skin: Secondary | ICD-10-CM | POA: Diagnosis not present

## 2024-01-18 DIAGNOSIS — D692 Other nonthrombocytopenic purpura: Secondary | ICD-10-CM | POA: Diagnosis not present

## 2024-01-18 DIAGNOSIS — D4819 Other specified neoplasm of uncertain behavior of connective and other soft tissue: Secondary | ICD-10-CM | POA: Diagnosis not present

## 2024-01-18 DIAGNOSIS — D485 Neoplasm of uncertain behavior of skin: Secondary | ICD-10-CM | POA: Diagnosis not present

## 2024-01-18 DIAGNOSIS — L821 Other seborrheic keratosis: Secondary | ICD-10-CM | POA: Diagnosis not present

## 2024-01-18 DIAGNOSIS — L570 Actinic keratosis: Secondary | ICD-10-CM | POA: Diagnosis not present

## 2024-02-08 DIAGNOSIS — H02135 Senile ectropion of left lower eyelid: Secondary | ICD-10-CM | POA: Diagnosis not present

## 2024-02-08 DIAGNOSIS — H02142 Spastic ectropion of right lower eyelid: Secondary | ICD-10-CM | POA: Diagnosis not present

## 2024-02-08 DIAGNOSIS — H04522 Eversion of left lacrimal punctum: Secondary | ICD-10-CM | POA: Diagnosis not present

## 2024-02-08 DIAGNOSIS — H16212 Exposure keratoconjunctivitis, left eye: Secondary | ICD-10-CM | POA: Diagnosis not present

## 2024-02-08 DIAGNOSIS — H16211 Exposure keratoconjunctivitis, right eye: Secondary | ICD-10-CM | POA: Diagnosis not present

## 2024-02-08 DIAGNOSIS — H04123 Dry eye syndrome of bilateral lacrimal glands: Secondary | ICD-10-CM | POA: Diagnosis not present

## 2024-02-08 DIAGNOSIS — H04521 Eversion of right lacrimal punctum: Secondary | ICD-10-CM | POA: Diagnosis not present

## 2024-02-08 DIAGNOSIS — H02145 Spastic ectropion of left lower eyelid: Secondary | ICD-10-CM | POA: Diagnosis not present

## 2024-02-08 DIAGNOSIS — H02112 Cicatricial ectropion of right lower eyelid: Secondary | ICD-10-CM | POA: Diagnosis not present

## 2024-02-08 DIAGNOSIS — H02115 Cicatricial ectropion of left lower eyelid: Secondary | ICD-10-CM | POA: Diagnosis not present

## 2024-02-08 DIAGNOSIS — H02132 Senile ectropion of right lower eyelid: Secondary | ICD-10-CM | POA: Diagnosis not present

## 2024-02-08 DIAGNOSIS — H16213 Exposure keratoconjunctivitis, bilateral: Secondary | ICD-10-CM | POA: Diagnosis not present

## 2024-02-10 DIAGNOSIS — R42 Dizziness and giddiness: Secondary | ICD-10-CM | POA: Diagnosis not present

## 2024-02-10 DIAGNOSIS — M150 Primary generalized (osteo)arthritis: Secondary | ICD-10-CM | POA: Diagnosis not present

## 2024-02-10 DIAGNOSIS — I10 Essential (primary) hypertension: Secondary | ICD-10-CM | POA: Diagnosis not present

## 2024-02-10 DIAGNOSIS — I6522 Occlusion and stenosis of left carotid artery: Secondary | ICD-10-CM | POA: Diagnosis not present

## 2024-02-10 DIAGNOSIS — Z7689 Persons encountering health services in other specified circumstances: Secondary | ICD-10-CM | POA: Diagnosis not present

## 2024-02-10 DIAGNOSIS — M8580 Other specified disorders of bone density and structure, unspecified site: Secondary | ICD-10-CM | POA: Diagnosis not present

## 2024-02-10 DIAGNOSIS — Z1331 Encounter for screening for depression: Secondary | ICD-10-CM | POA: Diagnosis not present

## 2024-02-10 DIAGNOSIS — E785 Hyperlipidemia, unspecified: Secondary | ICD-10-CM | POA: Diagnosis not present

## 2024-02-10 DIAGNOSIS — M353 Polymyalgia rheumatica: Secondary | ICD-10-CM | POA: Diagnosis not present

## 2024-02-10 DIAGNOSIS — M545 Low back pain, unspecified: Secondary | ICD-10-CM | POA: Diagnosis not present

## 2024-02-10 DIAGNOSIS — Z8546 Personal history of malignant neoplasm of prostate: Secondary | ICD-10-CM | POA: Diagnosis not present

## 2024-02-10 DIAGNOSIS — I739 Peripheral vascular disease, unspecified: Secondary | ICD-10-CM | POA: Diagnosis not present

## 2024-02-10 DIAGNOSIS — Z1389 Encounter for screening for other disorder: Secondary | ICD-10-CM | POA: Diagnosis not present

## 2024-02-10 DIAGNOSIS — R82998 Other abnormal findings in urine: Secondary | ICD-10-CM | POA: Diagnosis not present

## 2024-02-10 DIAGNOSIS — Z85828 Personal history of other malignant neoplasm of skin: Secondary | ICD-10-CM | POA: Diagnosis not present

## 2024-02-10 DIAGNOSIS — Z0000 Encounter for general adult medical examination without abnormal findings: Secondary | ICD-10-CM | POA: Diagnosis not present

## 2024-02-14 NOTE — Progress Notes (Unsigned)
 Cardiology Office Note:  .   Date:  02/17/2024  ID:  Jerry Booth, DOB 1931-02-14, MRN [id number] PCP: Valentin Skates, DO   HeartCare Providers Cardiologist:  Darryle ONEIDA Decent, MD { History of Present Illness: .    Chief Complaint  Patient presents with   PVC (premature ventricular contraction)   Follow-up    6 months    Jerry HOLDERMAN is a 89 y.o. male with history of cerebrovascular disease, HTN, HLD, PVCs who presents for follow-up.    History of Present Illness   Jerry Booth is a 89 year old male with a history of PVCs, stroke, and dizziness who presents for follow-up.  He experiences ongoing episodes of dizziness, described as feeling 'lightheaded' and 'funny' rather than vertigo or imbalance. These episodes occur sporadically, as infrequently as every three months, and can happen at any time, including while lying down or standing. Each episode lasts about 30 seconds to a minute. Hydration seems to alleviate symptoms, as he felt better after drinking water and Gatorade following four episodes in one morning. No specific triggers have been identified, though he suspects diet may play a role. No heart racing or nausea during dizzy episodes. He does not feel as though he is going to fall over during these episodes.  He has a history of premature ventricular contractions (PVCs) with a 10% burden. He was previously on metoprolol  12.5 mg in the morning but discontinued it due to lethargy and feeling 'washed out.' After resuming it for a month, he experienced the same side effects and stopped again. He questions the necessity of this medication as he does not tolerate it well.  History of significant cerebrovascular disease. He is currently on Lipitor 20 mg, with a recent LDL of 85, and takes aspirin 81 mg daily. He also takes amlodipine 2.5 mg daily for blood pressure.  He has a family history of vertigo, as his father and sister both suffered from it, but his  symptoms differ as he does not experience nausea or severe vertigo.  Socially, he is very active, playing golf two to three times a week and working out at [employer]. He lives alone, having lost his wife over two years ago, but has family nearby, including a son and grandson in [place]. He quit smoking 60 years ago and worked in [employer], which involved a lot of entertaining.         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 Pertinent positives noted in the HPI.     Studies Reviewed: SABRA       TTE 01/15/2022  1. Left ventricular ejection fraction, by estimation, is 55 to 60%. The  left ventricle has normal function. The left ventricle has no regional  wall motion abnormalities. Left ventricular diastolic parameters are  consistent with Grade I diastolic  dysfunction (impaired relaxation). The average left ventricular global  longitudinal strain is -19.6 %. The global longitudinal strain is normal.   2. Right ventricular systolic function is normal. The right ventricular  size is normal. Tricuspid regurgitation signal is inadequate for assessing  PA pressure.   3. No evidence of mitral valve regurgitation. Moderate mitral annular  calcification.   4. Aortic  valve regurgitation is not visualized. Aortic valve  sclerosis/calcification is present, without any evidence of aortic  stenosis.   5. The inferior vena cava is normal in size with greater than 50%  respiratory variability, suggesting right atrial pressure of 3 mmHg.   NM Stress 05/18/2023 Normal perfusion Mild systolic dysfunction (EF 45%).  Patient having PVCs that may have affected gating, suggest echocardiogram to better evaluate systolic function Low risk  study Physical Exam:   VS:  BP (!) 146/76 (BP Location: Left Arm, Patient Position: Sitting, Cuff Size: Normal)   Pulse 74   Resp 16   Ht 5' 9 (1.753 m)   Wt 161 lb (73 kg)   SpO2 95%   BMI 23.78 kg/m    Wt Readings from Last 3 Encounters:  02/17/24 161 lb (73 kg)  08/05/23 160 lb (72.6 kg)  07/15/23 163 lb 9.6 oz (74.2 kg)    GEN: Well nourished, well developed in no acute distress NECK: No JVD; L carotid bruit  CARDIAC: RRR, no murmurs, rubs, gallops RESPIRATORY:  Clear to auscultation without rales, wheezing or rhonchi  ABDOMEN: Soft, non-tender, non-distended EXTREMITIES:  No edema; No deformity  ASSESSMENT AND PLAN: .   Assessment and Plan    Dizziness likely related to vertebrobasilar insufficiency and carotid atherosclerosis Dizziness likely due to vertebrobasilar insufficiency and carotid atherosclerosis. Episodes are sporadic and brief, not linked to dehydration. - Encourage hydration with electrolytes such as Gatorade or Propel. - low suspicion for cardiac etiology but see discussion on PVCs.   Premature ventricular contractions (PVCs) PVCs with 10% burden. Metoprolol  poorly tolerated. No treatment needed unless heart function is compromised. - Order echocardiogram to assess heart function. - Hold metoprolol  unless echocardiogram shows heart function is weak.  Carotid atherosclerosis with stenosis Carotid atherosclerosis with slight left-sided blockage 50-69%. Last ultrasound two years ago. Monitoring required. - Order carotid duplex ultrasound.  Hyperlipidemia Hyperlipidemia managed with Lipitor 20 mg daily. LDL at 85, management effective. - Continue Lipitor 20 mg daily.  Hypertension Hypertension managed with amlodipine 2.5 mg daily. Blood pressure 146/76, management effective. - Continue amlodipine 2.5 mg daily.              Follow-up: Return in about 6 months (around 08/19/2024).   Signed, Darryle DASEN. Barbaraann, MD, [facility]  [facility]   [address] [place], [place] [address] [phone]  7:35 PM

## 2024-02-17 ENCOUNTER — Ambulatory Visit: Attending: Cardiology | Admitting: Cardiovascular Disease

## 2024-02-17 ENCOUNTER — Encounter: Payer: Self-pay | Admitting: Cardiovascular Disease

## 2024-02-17 VITALS — BP 146/76 | HR 74 | Resp 16 | Ht 69.0 in | Wt 161.0 lb

## 2024-02-17 DIAGNOSIS — E782 Mixed hyperlipidemia: Secondary | ICD-10-CM | POA: Diagnosis not present

## 2024-02-17 DIAGNOSIS — I6503 Occlusion and stenosis of bilateral vertebral arteries: Secondary | ICD-10-CM | POA: Diagnosis not present

## 2024-02-17 DIAGNOSIS — I150 Renovascular hypertension: Secondary | ICD-10-CM | POA: Diagnosis not present

## 2024-02-17 DIAGNOSIS — I6522 Occlusion and stenosis of left carotid artery: Secondary | ICD-10-CM

## 2024-02-17 DIAGNOSIS — R42 Dizziness and giddiness: Secondary | ICD-10-CM

## 2024-02-17 DIAGNOSIS — I679 Cerebrovascular disease, unspecified: Secondary | ICD-10-CM

## 2024-02-17 DIAGNOSIS — I493 Ventricular premature depolarization: Secondary | ICD-10-CM | POA: Diagnosis not present

## 2024-02-17 NOTE — Patient Instructions (Signed)
 Medication Instructions:  Your physician recommends that you continue on your current medications as directed. Please refer to the Current Medication list given to you today.  *If you need a refill on your cardiac medications before your next appointment, please call your pharmacy*   Testing/Procedures: Your physician has requested that you have an echocardiogram. Echocardiography is a painless test that uses sound waves to create images of your heart. It provides your doctor with information about the size and shape of your heart and how well your heart's chambers and valves are working. This procedure takes approximately one hour. There are no restrictions for this procedure. Please do NOT wear cologne, perfume, aftershave, or lotions (deodorant is allowed). Please arrive 15 minutes prior to your appointment time.  Please note: We ask at that you not bring children with you during ultrasound (echo/ vascular) testing. Due to room size and safety concerns, children are not allowed in the ultrasound rooms during exams. Our front office staff cannot provide observation of children in our lobby area while testing is being conducted. An adult accompanying a patient to their appointment will only be allowed in the ultrasound room at the discretion of the ultrasound technician under special circumstances. We apologize for any inconvenience.   Your physician has requested that you have a carotid duplex. This test is an ultrasound of the carotid arteries in your neck. It looks at blood flow through these arteries that supply the brain with blood. Allow one hour for this exam. There are no restrictions or special instructions. This will take place at [address] 4th floor  Please note: We ask at that you not bring children with you during ultrasound (echo/ vascular) testing. Due to room size and safety concerns, children are not allowed in the ultrasound rooms during exams. Our front office staff cannot  provide observation of children in our lobby area while testing is being conducted. An adult accompanying a patient to their appointment will only be allowed in the ultrasound room at the discretion of the ultrasound technician under special circumstances. We apologize for any inconvenience.   Follow-Up: At [facility], you and your health needs are our priority.  As part of our continuing mission to provide you with exceptional heart care, our providers are all part of one team.  This team includes your primary Cardiologist (physician) and Advanced Practice Providers or APPs (Physician Assistants and Nurse Practitioners) who all work together to provide you with the care you need, when you need it.  Your next appointment:   6 month(s)  Provider:   Dr. Barbaraann  We recommend signing up for the patient portal called MyChart.  Sign up information is provided on this After Visit Summary.  MyChart is used to connect with patients for Virtual Visits (Telemedicine).  Patients are able to view lab/test results, encounter notes, upcoming appointments, etc.  Non-urgent messages can be sent to your provider as well.   To learn more about what you can do with MyChart, go to [email or web address].

## 2024-03-03 DIAGNOSIS — C4442 Squamous cell carcinoma of skin of scalp and neck: Secondary | ICD-10-CM | POA: Diagnosis not present

## 2024-03-08 DIAGNOSIS — C44629 Squamous cell carcinoma of skin of left upper limb, including shoulder: Secondary | ICD-10-CM | POA: Diagnosis not present

## 2024-03-08 DIAGNOSIS — D485 Neoplasm of uncertain behavior of skin: Secondary | ICD-10-CM | POA: Diagnosis not present

## 2024-03-08 DIAGNOSIS — L570 Actinic keratosis: Secondary | ICD-10-CM | POA: Diagnosis not present

## 2024-03-14 ENCOUNTER — Ambulatory Visit (HOSPITAL_BASED_OUTPATIENT_CLINIC_OR_DEPARTMENT_OTHER)
Admission: RE | Admit: 2024-03-14 | Discharge: 2024-03-14 | Disposition: A | Discharge status: Home / Self Care | Source: Ambulatory Visit | Attending: Cardiovascular Disease

## 2024-03-14 ENCOUNTER — Ambulatory Visit: Payer: Self-pay | Admitting: Cardiovascular Disease

## 2024-03-14 ENCOUNTER — Ambulatory Visit (HOSPITAL_COMMUNITY)
Admission: RE | Admit: 2024-03-14 | Discharge: 2024-03-14 | Disposition: A | Discharge status: Home / Self Care | Source: Ambulatory Visit | Attending: Cardiovascular Disease | Admitting: Cardiovascular Disease

## 2024-03-14 DIAGNOSIS — I6503 Occlusion and stenosis of bilateral vertebral arteries: Secondary | ICD-10-CM | POA: Insufficient documentation

## 2024-03-14 DIAGNOSIS — I493 Ventricular premature depolarization: Secondary | ICD-10-CM | POA: Diagnosis not present

## 2024-03-14 DIAGNOSIS — R42 Dizziness and giddiness: Secondary | ICD-10-CM | POA: Diagnosis not present

## 2024-03-14 DIAGNOSIS — I6522 Occlusion and stenosis of left carotid artery: Secondary | ICD-10-CM

## 2024-03-14 DIAGNOSIS — I150 Renovascular hypertension: Secondary | ICD-10-CM | POA: Diagnosis not present

## 2024-03-14 DIAGNOSIS — E782 Mixed hyperlipidemia: Secondary | ICD-10-CM | POA: Diagnosis not present

## 2024-03-14 LAB — ECHOCARDIOGRAM COMPLETE
AR max vel: 2.05 cm2
AV Area VTI: 2.19 cm2
AV Area mean vel: 2.15 cm2
AV Mean grad: 4 mmHg
AV Peak grad: 8.2 mmHg
Ao pk vel: 1.43 m/s
Area-P 1/2: 2.73 cm2
S' Lateral: 3.09 cm

## 2024-03-21 DIAGNOSIS — H16211 Exposure keratoconjunctivitis, right eye: Secondary | ICD-10-CM | POA: Diagnosis not present

## 2024-03-21 DIAGNOSIS — H04522 Eversion of left lacrimal punctum: Secondary | ICD-10-CM | POA: Diagnosis not present

## 2024-03-21 DIAGNOSIS — H16213 Exposure keratoconjunctivitis, bilateral: Secondary | ICD-10-CM | POA: Diagnosis not present

## 2024-03-21 DIAGNOSIS — H02135 Senile ectropion of left lower eyelid: Secondary | ICD-10-CM | POA: Diagnosis not present

## 2024-03-21 DIAGNOSIS — H02132 Senile ectropion of right lower eyelid: Secondary | ICD-10-CM | POA: Diagnosis not present

## 2024-03-21 DIAGNOSIS — H02112 Cicatricial ectropion of right lower eyelid: Secondary | ICD-10-CM | POA: Diagnosis not present

## 2024-03-21 DIAGNOSIS — H02115 Cicatricial ectropion of left lower eyelid: Secondary | ICD-10-CM | POA: Diagnosis not present

## 2024-03-21 DIAGNOSIS — H16212 Exposure keratoconjunctivitis, left eye: Secondary | ICD-10-CM | POA: Diagnosis not present

## 2024-03-21 DIAGNOSIS — H04521 Eversion of right lacrimal punctum: Secondary | ICD-10-CM | POA: Diagnosis not present

## 2024-03-21 DIAGNOSIS — H02145 Spastic ectropion of left lower eyelid: Secondary | ICD-10-CM | POA: Diagnosis not present

## 2024-03-21 DIAGNOSIS — H04123 Dry eye syndrome of bilateral lacrimal glands: Secondary | ICD-10-CM | POA: Diagnosis not present

## 2024-03-21 DIAGNOSIS — H02142 Spastic ectropion of right lower eyelid: Secondary | ICD-10-CM | POA: Diagnosis not present

## 2024-03-28 DIAGNOSIS — D140 Benign neoplasm of middle ear, nasal cavity and accessory sinuses: Secondary | ICD-10-CM | POA: Diagnosis not present

## 2024-03-28 DIAGNOSIS — D485 Neoplasm of uncertain behavior of skin: Secondary | ICD-10-CM | POA: Diagnosis not present

## 2024-03-28 DIAGNOSIS — L570 Actinic keratosis: Secondary | ICD-10-CM | POA: Diagnosis not present

## 2024-03-28 DIAGNOSIS — L821 Other seborrheic keratosis: Secondary | ICD-10-CM | POA: Diagnosis not present

## 2024-03-28 DIAGNOSIS — C44622 Squamous cell carcinoma of skin of right upper limb, including shoulder: Secondary | ICD-10-CM | POA: Diagnosis not present

## 2024-04-21 DIAGNOSIS — L988 Other specified disorders of the skin and subcutaneous tissue: Secondary | ICD-10-CM | POA: Diagnosis not present

## 2024-04-21 DIAGNOSIS — C44629 Squamous cell carcinoma of skin of left upper limb, including shoulder: Secondary | ICD-10-CM | POA: Diagnosis not present

## 2024-04-22 DIAGNOSIS — R051 Acute cough: Secondary | ICD-10-CM | POA: Diagnosis not present

## 2024-04-22 DIAGNOSIS — Z7952 Long term (current) use of systemic steroids: Secondary | ICD-10-CM | POA: Diagnosis not present

## 2024-04-22 DIAGNOSIS — I10 Essential (primary) hypertension: Secondary | ICD-10-CM | POA: Diagnosis not present

## 2024-04-22 DIAGNOSIS — R0981 Nasal congestion: Secondary | ICD-10-CM | POA: Diagnosis not present

## 2024-04-22 DIAGNOSIS — R5383 Other fatigue: Secondary | ICD-10-CM | POA: Diagnosis not present

## 2024-04-22 DIAGNOSIS — Z1152 Encounter for screening for COVID-19: Secondary | ICD-10-CM | POA: Diagnosis not present

## 2024-04-22 DIAGNOSIS — J069 Acute upper respiratory infection, unspecified: Secondary | ICD-10-CM | POA: Diagnosis not present

## 2024-04-22 DIAGNOSIS — M353 Polymyalgia rheumatica: Secondary | ICD-10-CM | POA: Diagnosis not present

## 2024-05-13 DIAGNOSIS — D696 Thrombocytopenia, unspecified: Secondary | ICD-10-CM | POA: Diagnosis not present

## 2024-05-13 DIAGNOSIS — R059 Cough, unspecified: Secondary | ICD-10-CM | POA: Diagnosis not present

## 2024-05-13 DIAGNOSIS — M150 Primary generalized (osteo)arthritis: Secondary | ICD-10-CM | POA: Diagnosis not present

## 2024-05-13 DIAGNOSIS — I6522 Occlusion and stenosis of left carotid artery: Secondary | ICD-10-CM | POA: Diagnosis not present

## 2024-05-13 DIAGNOSIS — R42 Dizziness and giddiness: Secondary | ICD-10-CM | POA: Diagnosis not present

## 2024-05-13 DIAGNOSIS — R634 Abnormal weight loss: Secondary | ICD-10-CM | POA: Diagnosis not present

## 2024-05-13 DIAGNOSIS — I739 Peripheral vascular disease, unspecified: Secondary | ICD-10-CM | POA: Diagnosis not present

## 2024-05-13 DIAGNOSIS — I10 Essential (primary) hypertension: Secondary | ICD-10-CM | POA: Diagnosis not present

## 2024-05-13 DIAGNOSIS — D72819 Decreased white blood cell count, unspecified: Secondary | ICD-10-CM | POA: Diagnosis not present

## 2024-05-13 DIAGNOSIS — E785 Hyperlipidemia, unspecified: Secondary | ICD-10-CM | POA: Diagnosis not present

## 2024-05-13 DIAGNOSIS — M353 Polymyalgia rheumatica: Secondary | ICD-10-CM | POA: Diagnosis not present

## 2024-05-13 DIAGNOSIS — Z8546 Personal history of malignant neoplasm of prostate: Secondary | ICD-10-CM | POA: Diagnosis not present

## 2024-05-18 DIAGNOSIS — D72819 Decreased white blood cell count, unspecified: Secondary | ICD-10-CM | POA: Diagnosis not present

## 2024-08-19 ENCOUNTER — Ambulatory Visit: Admitting: Cardiovascular Disease
